# Patient Record
Sex: Male | Born: 1985 | Race: White | Hispanic: No | Marital: Single | Smoking: Former smoker
Health system: Southern US, Community
[De-identification: ages and names within clinical notes are randomized; demographics above are authoritative.]

## PROBLEM LIST (undated history)

## (undated) DIAGNOSIS — B009 Herpesviral infection, unspecified: Secondary | ICD-10-CM

## (undated) HISTORY — PX: HIP SURGERY: SHX245

---

## 2006-07-09 ENCOUNTER — Emergency Department (HOSPITAL_COMMUNITY): Admission: EM | Admit: 2006-07-09 | Discharge: 2006-07-09 | Payer: Self-pay | Admitting: Emergency Medicine

## 2006-10-12 ENCOUNTER — Emergency Department (HOSPITAL_COMMUNITY): Admission: EM | Admit: 2006-10-12 | Discharge: 2006-10-12 | Payer: Self-pay | Admitting: Emergency Medicine

## 2006-11-29 ENCOUNTER — Ambulatory Visit (HOSPITAL_COMMUNITY): Admission: RE | Admit: 2006-11-29 | Discharge: 2006-11-29 | Payer: Self-pay | Admitting: Family Medicine

## 2006-12-08 ENCOUNTER — Emergency Department (HOSPITAL_COMMUNITY): Admission: EM | Admit: 2006-12-08 | Discharge: 2006-12-08 | Payer: Self-pay | Admitting: Emergency Medicine

## 2007-07-10 ENCOUNTER — Emergency Department (HOSPITAL_COMMUNITY): Admission: EM | Admit: 2007-07-10 | Discharge: 2007-07-10 | Payer: Self-pay | Admitting: Emergency Medicine

## 2007-08-18 ENCOUNTER — Emergency Department (HOSPITAL_COMMUNITY): Admission: EM | Admit: 2007-08-18 | Discharge: 2007-08-18 | Payer: Self-pay | Admitting: Emergency Medicine

## 2007-10-09 ENCOUNTER — Emergency Department (HOSPITAL_COMMUNITY): Admission: EM | Admit: 2007-10-09 | Discharge: 2007-10-09 | Payer: Self-pay | Admitting: Family Medicine

## 2008-06-25 ENCOUNTER — Emergency Department (HOSPITAL_COMMUNITY): Admission: EM | Admit: 2008-06-25 | Discharge: 2008-06-25 | Payer: Self-pay | Admitting: Family Medicine

## 2008-11-01 ENCOUNTER — Emergency Department (HOSPITAL_COMMUNITY): Admission: EM | Admit: 2008-11-01 | Discharge: 2008-11-01 | Payer: Self-pay | Admitting: Otolaryngology

## 2009-03-14 ENCOUNTER — Emergency Department (HOSPITAL_COMMUNITY): Admission: EM | Admit: 2009-03-14 | Discharge: 2009-03-14 | Payer: Self-pay | Admitting: Emergency Medicine

## 2009-12-21 ENCOUNTER — Emergency Department (HOSPITAL_COMMUNITY): Admission: EM | Admit: 2009-12-21 | Discharge: 2009-12-21 | Payer: Self-pay | Admitting: Emergency Medicine

## 2010-03-20 ENCOUNTER — Emergency Department (HOSPITAL_COMMUNITY): Admission: EM | Admit: 2010-03-20 | Discharge: 2010-03-20 | Payer: Self-pay | Admitting: Emergency Medicine

## 2010-05-28 ENCOUNTER — Emergency Department (HOSPITAL_COMMUNITY): Admission: EM | Admit: 2010-05-28 | Discharge: 2010-05-28 | Payer: Self-pay | Admitting: Emergency Medicine

## 2010-09-19 ENCOUNTER — Emergency Department (HOSPITAL_COMMUNITY)
Admission: EM | Admit: 2010-09-19 | Discharge: 2010-09-19 | Payer: Self-pay | Source: Home / Self Care | Admitting: Emergency Medicine

## 2010-11-07 ENCOUNTER — Inpatient Hospital Stay (INDEPENDENT_AMBULATORY_CARE_PROVIDER_SITE_OTHER)
Admission: RE | Admit: 2010-11-07 | Discharge: 2010-11-07 | Disposition: A | Discharge status: Home / Self Care | Payer: Self-pay | Source: Ambulatory Visit | Attending: Family Medicine | Admitting: Family Medicine

## 2010-11-07 DIAGNOSIS — L989 Disorder of the skin and subcutaneous tissue, unspecified: Secondary | ICD-10-CM

## 2010-11-10 LAB — HERPES SIMPLEX VIRUS CULTURE: Culture: DETECTED

## 2011-01-13 LAB — URINALYSIS, ROUTINE W REFLEX MICROSCOPIC
Bilirubin Urine: NEGATIVE
Hgb urine dipstick: NEGATIVE
Ketones, ur: NEGATIVE mg/dL
Nitrite: NEGATIVE
Protein, ur: NEGATIVE mg/dL
Specific Gravity, Urine: 1.019 (ref 1.005–1.030)
pH: 7 (ref 5.0–8.0)

## 2011-01-13 LAB — URINE CULTURE: Colony Count: NO GROWTH

## 2011-07-07 LAB — GC/CHLAMYDIA PROBE AMP, GENITAL: GC Probe Amp, Genital: NEGATIVE

## 2011-07-07 LAB — HIV ANTIBODY (ROUTINE TESTING W REFLEX): HIV: NONREACTIVE

## 2011-07-31 ENCOUNTER — Inpatient Hospital Stay (HOSPITAL_COMMUNITY)
Admission: RE | Admit: 2011-07-31 | Discharge: 2011-07-31 | Disposition: A | Discharge status: Home / Self Care | Payer: Self-pay | Source: Ambulatory Visit | Attending: Family Medicine | Admitting: Family Medicine

## 2012-01-03 ENCOUNTER — Emergency Department (HOSPITAL_BASED_OUTPATIENT_CLINIC_OR_DEPARTMENT_OTHER)
Admission: EM | Admit: 2012-01-03 | Discharge: 2012-01-03 | Disposition: A | Discharge status: Home / Self Care | Payer: No Typology Code available for payment source | Attending: Emergency Medicine | Admitting: Emergency Medicine

## 2012-01-03 ENCOUNTER — Emergency Department (INDEPENDENT_AMBULATORY_CARE_PROVIDER_SITE_OTHER): Payer: No Typology Code available for payment source

## 2012-01-03 ENCOUNTER — Encounter (HOSPITAL_BASED_OUTPATIENT_CLINIC_OR_DEPARTMENT_OTHER): Payer: Self-pay | Admitting: *Deleted

## 2012-01-03 DIAGNOSIS — M25569 Pain in unspecified knee: Secondary | ICD-10-CM | POA: Insufficient documentation

## 2012-01-03 DIAGNOSIS — T1490XA Injury, unspecified, initial encounter: Secondary | ICD-10-CM | POA: Insufficient documentation

## 2012-01-03 DIAGNOSIS — Y92410 Unspecified street and highway as the place of occurrence of the external cause: Secondary | ICD-10-CM | POA: Insufficient documentation

## 2012-01-03 MED ORDER — IBUPROFEN 800 MG PO TABS
800.0000 mg | ORAL_TABLET | Freq: Once | ORAL | Status: AC
  Administered 2012-01-03: 800 mg via ORAL
  Filled 2012-01-03: qty 1

## 2012-01-03 MED ORDER — HYDROCODONE-ACETAMINOPHEN 5-500 MG PO TABS: 1.0000 | ORAL_TABLET | Freq: Four times a day (QID) | ORAL | Status: AC | PRN

## 2012-01-03 MED ORDER — OXYCODONE-ACETAMINOPHEN 5-325 MG PO TABS
2.0000 | ORAL_TABLET | Freq: Once | ORAL | Status: AC
  Administered 2012-01-03: 2 via ORAL
  Filled 2012-01-03: qty 2

## 2012-01-03 MED ORDER — NAPROXEN 500 MG PO TABS: 500.0000 mg | ORAL_TABLET | Freq: Two times a day (BID) | ORAL | Status: DC

## 2012-01-03 NOTE — Discharge Instructions (Signed)
Knee Pain The knee is the complex joint between your thigh and your lower leg. It is made up of bones, tendons, ligaments, and cartilage. The bones that make up the knee are:  The femur in the thigh.   The tibia and fibula in the lower leg.   The patella or kneecap riding in the groove on the lower femur.  CAUSES  Knee pain is a common complaint with many causes. A few of these causes are:  Injury, such as:   A ruptured ligament or tendon injury.   Torn cartilage.   Medical conditions, such as:   Gout   Arthritis   Infections   Overuse, over training or overdoing a physical activity.  Knee pain can be minor or severe. Knee pain can accompany debilitating injury. Minor knee problems often respond well to self-care measures or get well on their own. More serious injuries may need medical intervention or even surgery. SYMPTOMS The knee is complex. Symptoms of knee problems can vary widely. Some of the problems are:  Pain with movement and weight bearing.   Swelling and tenderness.   Buckling of the knee.   Inability to straighten or extend your knee.   Your knee locks and you cannot straighten it.   Warmth and redness with pain and fever.   Deformity or dislocation of the kneecap.  DIAGNOSIS  Determining what is wrong may be very straight forward such as when there is an injury. It can also be challenging because of the complexity of the knee. Tests to make a diagnosis may include:  Your caregiver taking a history and doing a physical exam.   Routine X-rays can be used to rule out other problems. X-rays will not reveal a cartilage tear. Some injuries of the knee can be diagnosed by:   Arthroscopy a surgical technique by which a small video camera is inserted through tiny incisions on the sides of the knee. This procedure is used to examine and repair internal knee joint problems. Tiny instruments can be used during arthroscopy to repair the torn knee cartilage  (meniscus).   Arthrography is a radiology technique. A contrast liquid is directly injected into the knee joint. Internal structures of the knee joint then become visible on X-ray film.   An MRI scan is a non x-ray radiology procedure in which magnetic fields and a computer produce two- or three-dimensional images of the inside of the knee. Cartilage tears are often visible using an MRI scanner. MRI scans have largely replaced arthrography in diagnosing cartilage tears of the knee.   Blood work.   Examination of the fluid that helps to lubricate the knee joint (synovial fluid). This is done by taking a sample out using a needle and a syringe.  TREATMENT The treatment of knee problems depends on the cause. Some of these treatments are:  Depending on the injury, proper casting, splinting, surgery or physical therapy care will be needed.   Give yourself adequate recovery time. Do not overuse your joints. If you begin to get sore during workout routines, back off. Slow down or do fewer repetitions.   For repetitive activities such as cycling or running, maintain your strength and nutrition.   Alternate muscle groups. For example if you are a weight lifter, work the upper body on one day and the lower body the next.   Either tight or weak muscles do not give the proper support for your knee. Tight or weak muscles do not absorb the stress placed   on the knee joint. Keep the muscles surrounding the knee strong.   Take care of mechanical problems.   If you have flat feet, orthotics or special shoes may help. See your caregiver if you need help.   Arch supports, sometimes with wedges on the inner or outer aspect of the heel, can help. These can shift pressure away from the side of the knee most bothered by osteoarthritis.   A brace called an "unloader" brace also may be used to help ease the pressure on the most arthritic side of the knee.   If your caregiver has prescribed crutches, braces,  wraps or ice, use as directed. The acronym for this is PRICE. This means protection, rest, ice, compression and elevation.   Nonsteroidal anti-inflammatory drugs (NSAID's), can help relieve pain. But if taken immediately after an injury, they may actually increase swelling. Take NSAID's with food in your stomach. Stop them if you develop stomach problems. Do not take these if you have a history of ulcers, stomach pain or bleeding from the bowel. Do not take without your caregiver's approval if you have problems with fluid retention, heart failure, or kidney problems.   For ongoing knee problems, physical therapy may be helpful.   Glucosamine and chondroitin are over-the-counter dietary supplements. Both may help relieve the pain of osteoarthritis in the knee. These medicines are different from the usual anti-inflammatory drugs. Glucosamine may decrease the rate of cartilage destruction.   Injections of a corticosteroid drug into your knee joint may help reduce the symptoms of an arthritis flare-up. They may provide pain relief that lasts a few months. You may have to wait a few months between injections. The injections do have a small increased risk of infection, water retention and elevated blood sugar levels.   Hyaluronic acid injected into damaged joints may ease pain and provide lubrication. These injections may work by reducing inflammation. A series of shots may give relief for as long as 6 months.   Topical painkillers. Applying certain ointments to your skin may help relieve the pain and stiffness of osteoarthritis. Ask your pharmacist for suggestions. Many over the-counter products are approved for temporary relief of arthritis pain.   In some countries, doctors often prescribe topical NSAID's for relief of chronic conditions such as arthritis and tendinitis. A review of treatment with NSAID creams found that they worked as well as oral medications but without the serious side effects.    PREVENTION  Maintain a healthy weight. Extra pounds put more strain on your joints.   Get strong, stay limber. Weak muscles are a common cause of knee injuries. Stretching is important. Include flexibility exercises in your workouts.   Be smart about exercise. If you have osteoarthritis, chronic knee pain or recurring injuries, you may need to change the way you exercise. This does not mean you have to stop being active. If your knees ache after jogging or playing basketball, consider switching to swimming, water aerobics or other low-impact activities, at least for a few days a week. Sometimes limiting high-impact activities will provide relief.   Make sure your shoes fit well. Choose footwear that is right for your sport.   Protect your knees. Use the proper gear for knee-sensitive activities. Use kneepads when playing volleyball or laying carpet. Buckle your seat belt every time you drive. Most shattered kneecaps occur in car accidents.   Rest when you are tired.  SEEK MEDICAL CARE IF:  You have knee pain that is continual and does not   seem to be getting better.  SEEK IMMEDIATE MEDICAL CARE IF:  Your knee joint feels hot to the touch and you have a high fever. MAKE SURE YOU:   Understand these instructions.   Will watch your condition.   Will get help right away if you are not doing well or get worse.  Document Released: 07/12/2007 Document Revised: 09/03/2011 Document Reviewed: 07/12/2007 [employer] Patient Information 2012 [employer]. 
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
## 2012-01-06 ENCOUNTER — Encounter: Payer: Self-pay | Admitting: Family Medicine

## 2012-01-06 ENCOUNTER — Ambulatory Visit (INDEPENDENT_AMBULATORY_CARE_PROVIDER_SITE_OTHER): Payer: Self-pay | Admitting: Family Medicine

## 2012-01-06 VITALS — BP 117/75 | HR 102 | Temp 97.8°F | Ht 72.0 in | Wt 230.0 lb

## 2012-01-06 DIAGNOSIS — M25569 Pain in unspecified knee: Secondary | ICD-10-CM

## 2012-01-06 DIAGNOSIS — M25562 Pain in left knee: Secondary | ICD-10-CM

## 2012-01-06 NOTE — Patient Instructions (Signed)
Your exam indicates you have a Grade 1 sprain to the MCL of your right knee and LCL of your left knee as well as meniscal contusions. These typically improve after a total of 2-4 weeks. Icing 15 minutes 3-4 times a day. Knee braces tend not to help - time is the biggest thing with these. Aleve 1-2 tabs twice a day with food for pain and inflammation as needed. Tylenol as needed for pain in addition to this. If not improving, would consider MRI and/or physical therapy depending on your exam at that time.

## 2012-01-07 ENCOUNTER — Encounter: Payer: Self-pay | Admitting: Family Medicine

## 2012-01-07 DIAGNOSIS — M25561 Pain in right knee: Secondary | ICD-10-CM | POA: Insufficient documentation

## 2012-01-07 NOTE — Assessment & Plan Note (Signed)
no effusion, mechanism would be unlikely to cause meniscal tears though side to side motion and exam indicates he sprained MCL right knee and LCL left knee as well as has bone/meniscal bruising.  Should resolve after total 2-4 weeks.  Icing, nsaids, tylenol as needed.  Advised to follow-through on applying for cone coverage so can consider PT and/or further imaging if not improving over the next month as expected.  See instructions.
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
## 2012-03-13 ENCOUNTER — Encounter (HOSPITAL_COMMUNITY): Payer: Self-pay | Admitting: *Deleted

## 2012-03-13 ENCOUNTER — Emergency Department (HOSPITAL_COMMUNITY): Payer: Self-pay

## 2012-03-13 ENCOUNTER — Emergency Department (HOSPITAL_COMMUNITY)
Admission: EM | Admit: 2012-03-13 | Discharge: 2012-03-13 | Disposition: A | Discharge status: Home / Self Care | Payer: Self-pay | Attending: Emergency Medicine | Admitting: Emergency Medicine

## 2012-03-13 DIAGNOSIS — F17200 Nicotine dependence, unspecified, uncomplicated: Secondary | ICD-10-CM | POA: Insufficient documentation

## 2012-03-13 DIAGNOSIS — R0789 Other chest pain: Secondary | ICD-10-CM | POA: Insufficient documentation

## 2012-03-13 DIAGNOSIS — R079 Chest pain, unspecified: Secondary | ICD-10-CM

## 2012-03-13 LAB — CBC
MCH: 30.2 pg (ref 26.0–34.0)
RBC: 5.33 MIL/uL (ref 4.22–5.81)
RDW: 13.9 % (ref 11.5–15.5)

## 2012-03-13 LAB — DIFFERENTIAL
Eosinophils Absolute: 0.3 10*3/uL (ref 0.0–0.7)
Eosinophils Relative: 5 % (ref 0–5)
Monocytes Relative: 9 % (ref 3–12)

## 2012-03-13 LAB — COMPREHENSIVE METABOLIC PANEL
BUN: 11 mg/dL (ref 6–23)
Chloride: 102 mEq/L (ref 96–112)
Creatinine, Ser: 1.03 mg/dL (ref 0.50–1.35)
GFR calc non Af Amer: >90 mL/min (ref >90)

## 2012-03-13 MED ORDER — NAPROXEN 500 MG PO TABS: 500.0000 mg | ORAL_TABLET | Freq: Two times a day (BID) | ORAL | Status: DC

## 2012-03-13 NOTE — ED Provider Notes (Signed)
History     CSN: [id number]  Arrival date & time 03/13/12  2036   First MD Initiated Contact with Patient 03/13/12 2302      Chief Complaint  Patient presents with  . Chest Pain    (Consider location/radiation/quality/duration/timing/severity/associated sxs/prior treatment) HPI Comments: Pt is a 26yo male that has had chest pain that comes and goes for the last year. States pain with palpation. Pt state he does exercise and runs 4 miles 4 times a week and does not have pain at that time. States no hx of cardiac problems. No fever, chills, cough, shortness of breath. NO injuries. Has not taken any medications. No fam hx of cardiac problems.   The history is provided by the patient.    History reviewed. No pertinent past medical history.  History reviewed. No pertinent past surgical history.  Family History  Problem Relation Age of Onset  . Diabetes Mother   . Hyperlipidemia Mother   . Hypertension Mother   . Heart attack Neg Hx   . Sudden death Neg Hx     History  Substance Use Topics  . Smoking status: Current Some Day Smoker  . Smokeless tobacco: Not on file  . Alcohol Use: Yes      Review of Systems  Constitutional: Negative for fever and chills.  Respiratory: Positive for chest tightness. Negative for cough, shortness of breath, wheezing and stridor.   Cardiovascular: Positive for chest pain. Negative for palpitations and leg swelling.  Gastrointestinal: Negative.   Musculoskeletal: Negative.   Skin: Negative.   Neurological: Negative for dizziness and weakness.    Allergies  Review of patient's allergies indicates no known allergies.  Home Medications  No current outpatient prescriptions on file.  BP 126/66  Pulse 85  Temp 98.6 F (37 C)  Resp 18  SpO2 98%  Physical Exam  Nursing note and vitals reviewed. Constitutional: He is oriented to person, place, and time. He appears well-developed and well-nourished.  HENT:  Head: Normocephalic.  Eyes:  Conjunctivae are normal.  Neck: Neck supple.  Cardiovascular: Normal rate, regular rhythm and normal heart sounds.   Pulmonary/Chest: Effort normal and breath sounds normal. No respiratory distress. He has no wheezes. He has no rales.       Normal appearing chest. No bruising, swelilng, deformity, crepitus. Tender to palpation over left uper ribs.   Abdominal: Soft. Bowel sounds are normal. He exhibits no distension. There is no tenderness. There is no rebound.  Neurological: He is alert and oriented to person, place, and time.  Skin: Skin is warm and dry.  Psychiatric: He has a normal mood and affect.    ED Course  Procedures (including critical care time)  Pt with reproducible chest pain with palpation for a year. No exertional component, no associated symptoms. TIMI 0. No med problems.    Date: 03/13/2012  Rate: 97  Rhythm: normal sinus rhythm  QRS Axis: normal  Intervals: normal  ST/T Wave abnormalities: normal  Conduction Disutrbances:none  Narrative Interpretation:   Old EKG Reviewed: unchanged  Results for orders placed during the hospital encounter of 03/13/12  CBC      Component Value Range   WBC 6.5  4.0 - 10.5 K/uL   RBC 5.33  4.22 - 5.81 MIL/uL   Hemoglobin 16.1  13.0 - 17.0 g/dL   HCT 40.9  81.1 - 91.4 %   MCV 85.7  78.0 - 100.0 fL   MCH 30.2  26.0 - 34.0 pg   MCHC 35.2  30.0 - 36.0 g/dL   RDW 16.1  09.6 - 04.5 %   Platelets 121 (*) 150 - 400 K/uL  DIFFERENTIAL      Component Value Range   Neutrophils Relative 51  43 - 77 %   Neutro Abs 3.3  1.7 - 7.7 K/uL   Lymphocytes Relative 34  12 - 46 %   Lymphs Abs 2.2  0.7 - 4.0 K/uL   Monocytes Relative 9  3 - 12 %   Monocytes Absolute 0.6  0.1 - 1.0 K/uL   Eosinophils Relative 5  0 - 5 %   Eosinophils Absolute 0.3  0.0 - 0.7 K/uL   Basophils Relative 1  0 - 1 %   Basophils Absolute 0.0  0.0 - 0.1 K/uL  COMPREHENSIVE METABOLIC PANEL      Component Value Range   Sodium 139  135 - 145 mEq/L   Potassium 3.8  3.5  - 5.1 mEq/L   Chloride 102  96 - 112 mEq/L   CO2 26  19 - 32 mEq/L   Glucose, Bld 94  70 - 99 mg/dL   BUN 11  6 - 23 mg/dL   Creatinine, Ser 4.09  0.50 - 1.35 mg/dL   Calcium 9.9  8.4 - 81.1 mg/dL   Total Protein 7.6  6.0 - 8.3 g/dL   Albumin 4.7  3.5 - 5.2 g/dL   AST 29  0 - 37 U/L   ALT 45  0 - 53 U/L   Alkaline Phosphatase 60  39 - 117 U/L   Total Bilirubin 0.5  0.3 - 1.2 mg/dL   GFR calc non Af Amer >90  >90 mL/min   GFR calc Af Amer >90  >90 mL/min  TROPONIN I      Component Value Range   Troponin I <0.30  <0.30 ng/mL   Dg Chest 2 View  03/13/2012  *RADIOLOGY REPORT*  Clinical Data: Left-sided chest pain.  CHEST - 2 VIEW  Comparison:  03/20/2010  Findings:  The heart size and mediastinal contours are within normal limits.  Both lungs are clear.  The visualized skeletal structures are unremarkable.  IMPRESSION: No active cardiopulmonary disease.  Original Report Authenticated By: Danae Orleans, M.D.    11:29 PM Negative labs. I suspect pts pain is musculoskeletal, possible constocondritis. Doubt PE, wells and perc negative. Will d/c home with PCP follow up.   1. Chest pain   2. Costochondral chest pain       MDM          Lottie Mussel, PA 03/14/12 0126

## 2012-03-13 NOTE — ED Notes (Signed)
Pt states pain increase with movement sharp in nature. Pt placed on monitor. Titayana pa at bedside.

## 2012-03-13 NOTE — ED Notes (Signed)
Pt not in room for discharge. Verbal instructions given per [place] PA. Pt alert x3 nad.

## 2012-03-13 NOTE — Discharge Instructions (Signed)
Your lab work and chest x-ray are normal. Stop smoking. Naprosyn for pain. Follow up with primary care doctor.   Chest Wall Pain Chest wall pain is pain in or around the bones and muscles of your chest. It may take up to 6 weeks to get better. It may take longer if you must stay physically active in your work and activities.  CAUSES  Chest wall pain may happen on its own. However, it may be caused by:  A viral illness like the flu.   Injury.   Coughing.   Exercise.   Arthritis.   Fibromyalgia.   Shingles.  HOME CARE INSTRUCTIONS   Avoid overtiring physical activity. Try not to strain or perform activities that cause pain. This includes any activities using your chest or your abdominal and side muscles, especially if heavy weights are used.   Put ice on the sore area.   Put ice in a plastic bag.   Place a towel between your skin and the bag.   Leave the ice on for 15 to 20 minutes per hour while awake for the first 2 days.   Only take over-the-counter or prescription medicines for pain, discomfort, or fever as directed by your caregiver.  SEEK IMMEDIATE MEDICAL CARE IF:   Your pain increases, or you are very uncomfortable.   You have a fever.   Your chest pain becomes worse.   You have new, unexplained symptoms.   You have nausea or vomiting.   You feel sweaty or lightheaded.   You have a cough with phlegm (sputum), or you cough up blood.  MAKE SURE YOU:   Understand these instructions.   Will watch your condition.   Will get help right away if you are not doing well or get worse.  Document Released: 09/14/2005 Document Revised: 09/03/2011 Document Reviewed: 05/11/2011 [facility] Patient Information 2012 [place], [place].

## 2012-03-13 NOTE — ED Notes (Signed)
Lt upper chest pain for 3 months that comes and goes.  On fathers day he decided to have it checked

## 2012-03-14 NOTE — ED Provider Notes (Signed)
Medical screening examination/treatment/procedure(s) were performed by non-physician practitioner and as supervising physician I was immediately available for consultation/collaboration.   Arlene Brickel A Aryonna Gunnerson, MD 03/14/12 0602 

## 2012-09-20 ENCOUNTER — Encounter (HOSPITAL_BASED_OUTPATIENT_CLINIC_OR_DEPARTMENT_OTHER): Payer: Self-pay | Admitting: *Deleted

## 2012-09-20 ENCOUNTER — Emergency Department (HOSPITAL_BASED_OUTPATIENT_CLINIC_OR_DEPARTMENT_OTHER)
Admission: EM | Admit: 2012-09-20 | Discharge: 2012-09-20 | Disposition: A | Discharge status: Home / Self Care | Payer: Self-pay | Attending: Emergency Medicine | Admitting: Emergency Medicine

## 2012-09-20 ENCOUNTER — Emergency Department (HOSPITAL_BASED_OUTPATIENT_CLINIC_OR_DEPARTMENT_OTHER): Payer: Self-pay

## 2012-09-20 DIAGNOSIS — M719 Bursopathy, unspecified: Secondary | ICD-10-CM | POA: Insufficient documentation

## 2012-09-20 DIAGNOSIS — F17200 Nicotine dependence, unspecified, uncomplicated: Secondary | ICD-10-CM | POA: Insufficient documentation

## 2012-09-20 DIAGNOSIS — Z791 Long term (current) use of non-steroidal anti-inflammatories (NSAID): Secondary | ICD-10-CM | POA: Insufficient documentation

## 2012-09-20 DIAGNOSIS — X500XXA Overexertion from strenuous movement or load, initial encounter: Secondary | ICD-10-CM | POA: Insufficient documentation

## 2012-09-20 DIAGNOSIS — Y929 Unspecified place or not applicable: Secondary | ICD-10-CM | POA: Insufficient documentation

## 2012-09-20 DIAGNOSIS — M25419 Effusion, unspecified shoulder: Secondary | ICD-10-CM | POA: Insufficient documentation

## 2012-09-20 DIAGNOSIS — M67919 Unspecified disorder of synovium and tendon, unspecified shoulder: Secondary | ICD-10-CM | POA: Insufficient documentation

## 2012-09-20 DIAGNOSIS — Y939 Activity, unspecified: Secondary | ICD-10-CM | POA: Insufficient documentation

## 2012-09-20 MED ORDER — HYDROCODONE-ACETAMINOPHEN 5-325 MG PO TABS: 2.0000 | ORAL_TABLET | ORAL | Status: DC | PRN

## 2012-09-20 MED ORDER — IBUPROFEN 800 MG PO TABS: 800.0000 mg | ORAL_TABLET | Freq: Three times a day (TID) | ORAL | Status: DC

## 2012-09-20 NOTE — ED Provider Notes (Signed)
History     CSN: [id number]  Arrival date & time 09/20/12  1302   First MD Initiated Contact with Patient 09/20/12 1516      Chief Complaint  Patient presents with  . Shoulder Pain    (Consider location/radiation/quality/duration/timing/severity/associated sxs/prior treatment) Patient is a 26 y.o. male presenting with shoulder pain. The history is provided by the patient. No language interpreter was used.  Shoulder Pain This is a new problem. The current episode started today. The problem occurs constantly. The problem has been gradually worsening. Associated symptoms include joint swelling. The symptoms are aggravated by bending. He has tried nothing for the symptoms. The treatment provided moderate relief.   Pt complains of pain in left shoulder for a month.   Pt thinks he may have injured while lifting. History reviewed. No pertinent past medical history.  History reviewed. No pertinent past surgical history.  Family History  Problem Relation Age of Onset  . Diabetes Mother   . Hyperlipidemia Mother   . Hypertension Mother   . Heart attack Neg Hx   . Sudden death Neg Hx     History  Substance Use Topics  . Smoking status: Current Some Day Smoker  . Smokeless tobacco: Not on file  . Alcohol Use: Yes      Review of Systems  Musculoskeletal: Positive for joint swelling.  All other systems reviewed and are negative.    Allergies  Review of patient's allergies indicates no known allergies.  Home Medications   Current Outpatient Rx  Name  Route  Sig  Dispense  Refill  . NAPROXEN 500 MG PO TABS   Oral   Take 1 tablet (500 mg total) by mouth 2 (two) times daily.   30 tablet   0     BP 116/60  Pulse 68  Temp 98 F (36.7 C) (Oral)  Resp 20  SpO2 100%  Physical Exam  Nursing note and vitals reviewed. Constitutional: He is oriented to person, place, and time. He appears well-developed and well-nourished.  HENT:  Head: Normocephalic and atraumatic.   Eyes: Pupils are equal, round, and reactive to light.  Neck: Normal range of motion. Neck supple.  Cardiovascular: Normal rate.   Pulmonary/Chest: Effort normal.  Musculoskeletal: Normal range of motion. He exhibits tenderness.       Tender left shoulder,  Decreased range of motion,   nv and ns intact,  Pain with abduction  Neurological: He is alert and oriented to person, place, and time. He has normal reflexes.  Skin: Skin is warm.    ED Course  Procedures (including critical care time)  Labs Reviewed - No data to display Dg Shoulder Left  09/20/2012  *RADIOLOGY REPORT*  Clinical Data: Injury 1 month ago.  Pain.  LEFT SHOULDER - 2+ VIEW  Comparison: None.  Findings: Imaged bones, joints and soft tissues appear normal.  IMPRESSION: Normal study.   Original Report Authenticated By: Holley Dexter, M.D.      No diagnosis found.    MDM  Proable rotator cuff tendonitis.   Pt given rx for ibuprofen and hydrocodone.   Pt advised to see Dr. Pearletha Forge for followup        Elson Areas, [place] 09/20/12 1534

## 2012-09-20 NOTE — ED Notes (Signed)
Left shoulder pain. Injury a month ago and has not gotten better.

## 2012-09-20 NOTE — ED Notes (Signed)
MD at bedside. 

## 2012-09-23 ENCOUNTER — Ambulatory Visit (INDEPENDENT_AMBULATORY_CARE_PROVIDER_SITE_OTHER): Payer: Self-pay | Admitting: Sports Medicine

## 2012-09-23 ENCOUNTER — Encounter: Payer: Self-pay | Admitting: Sports Medicine

## 2012-09-23 VITALS — BP 131/70 | HR 76 | Wt 242.0 lb

## 2012-09-23 DIAGNOSIS — M25512 Pain in left shoulder: Secondary | ICD-10-CM | POA: Insufficient documentation

## 2012-09-23 DIAGNOSIS — M25519 Pain in unspecified shoulder: Secondary | ICD-10-CM

## 2012-09-23 MED ORDER — HYDROCODONE-ACETAMINOPHEN 5-325 MG PO TABS
1.0000 | ORAL_TABLET | Freq: Three times a day (TID) | ORAL | Status: AC | PRN
Start: 2012-09-23 — End: 2012-10-03

## 2012-09-23 MED ORDER — MELOXICAM 15 MG PO TABS: ORAL_TABLET | ORAL | Status: DC

## 2012-09-23 NOTE — Progress Notes (Signed)
Subjective:    CC: Establish care. 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 Unfortunately he developed pain he localizes on the anterior aspect of his left shoulder which is worse with reaching and essentially any direction. He denies any trauma. The pain is localized and does not radiate. It is severe, it is waking him up at night. He's tried some hydrocodone prescribed at the emergency department which is only been moderately effective. He has not been doing any rehabilitation exercises.  Past medical history, Surgical history, Family history, Social history, Allergies, and medications have been entered into the medical record, reviewed, and no changes needed.   Review of Systems: No headache, visual changes, nausea, vomiting, diarrhea, constipation, dizziness, abdominal pain, skin rash, fevers, chills, night sweats, swollen lymph nodes, weight loss, chest pain, body aches, joint swelling, muscle aches, shortness of breath, mood changes, visual or auditory hallucinations.  Objective:    General: Well Developed, well nourished, and in no acute distress.  Neuro: Alert and oriented x3, extra-ocular muscles intact.  HEENT: Normocephalic, atraumatic, pupils equal round reactive to light, neck supple, no masses, no lymphadenopathy, thyroid nonpalpable.  Skin: Warm and dry, no rashes noted.  Cardiac: Regular rate and rhythm, no murmurs rubs or gallops.  Respiratory: Clear to auscultation bilaterally. Not using accessory muscles, speaking in full sentences.  Abdominal: Soft, nontender, nondistended, positive bowel sounds, no masses, no organomegaly.  Left Shoulder: Inspection reveals no abnormalities, atrophy or asymmetry. Reproduction of pain with palpation over the bicipital groove. ROM is full in all planes. Rotator cuff strength normal throughout. No signs of impingement  with negative Neer and Hawkin's tests, empty can sign. Positive speeds and Yergason test. No labral pathology noted with negative Obrien's, negative clunk and good stability. Normal scapular function observed. No painful arc and no drop arm sign. No apprehension sign  Procedure: Real-time Ultrasound Guided Injection of left biceps tendon sheath Device: GE Logiq E  Ultrasound guided injection is preferred based studies that show increased duration, increased effect, greater accuracy, decreased procedural pain, increased response rate, and decreased cost with ultrasound guided versus blind injection.  Verbal informed consent obtained.  Time-out conducted.  Noted no overlying erythema, induration, or other signs of local infection.  Skin prepped in a sterile fashion.  Local anesthesia: Topical Ethyl chloride.  With sterile technique and under real time ultrasound guidance:  Needle advanced to enter tubercular groove, care was taken to avoid puncturing the biceps tendon itself. 1 cc of Kenalog 40, 3 cc lidocaine injected easily, and biceps tendon sheath seen distending, no intratendinous injection was performed. Completed without difficulty  Pain immediately resolved suggesting accurate placement of the medication.  Advised to call if fevers/chills, erythema, induration, drainage, or persistent bleeding.  Images permanently stored and available for review in the ultrasound unit.  Impression: Technically successful ultrasound guided injection.  Impression and Recommendations:    The patient was counselled, risk factors were discussed, anticipatory guidance given.

## 2012-09-23 NOTE — Assessment & Plan Note (Signed)
Exam is more specific for biceps tendinitis, cuff testing is equivocal. Ultrasound guided injection as above. Formal physical therapy along with Mobic. Return to clinic in 4 weeks to reassess.

## 2012-09-27 NOTE — ED Provider Notes (Signed)
Medical screening examination/treatment/procedure(s) were performed by non-physician practitioner and as supervising physician I was immediately available for consultation/collaboration.   Rolan Bucco, MD 09/27/12 [phone]

## 2012-09-30 ENCOUNTER — Ambulatory Visit: Payer: Self-pay | Admitting: Physical Therapy

## 2012-10-21 ENCOUNTER — Ambulatory Visit: Payer: Self-pay | Admitting: Sports Medicine

## 2012-10-21 DIAGNOSIS — Z0289 Encounter for other administrative examinations: Secondary | ICD-10-CM

## 2013-01-31 ENCOUNTER — Emergency Department (HOSPITAL_COMMUNITY)
Admission: EM | Admit: 2013-01-31 | Discharge: 2013-01-31 | Disposition: A | Discharge status: Home / Self Care | Payer: Self-pay | Attending: Emergency Medicine | Admitting: Emergency Medicine

## 2013-01-31 ENCOUNTER — Encounter (HOSPITAL_COMMUNITY): Payer: Self-pay | Admitting: *Deleted

## 2013-01-31 DIAGNOSIS — K0889 Other specified disorders of teeth and supporting structures: Secondary | ICD-10-CM

## 2013-01-31 DIAGNOSIS — K089 Disorder of teeth and supporting structures, unspecified: Secondary | ICD-10-CM | POA: Insufficient documentation

## 2013-01-31 DIAGNOSIS — F17200 Nicotine dependence, unspecified, uncomplicated: Secondary | ICD-10-CM | POA: Insufficient documentation

## 2013-01-31 MED ORDER — HYDROCODONE-ACETAMINOPHEN 5-325 MG PO TABS: 2.0000 | ORAL_TABLET | ORAL | Status: DC | PRN

## 2013-01-31 MED ORDER — PENICILLIN V POTASSIUM 500 MG PO TABS: 500.0000 mg | ORAL_TABLET | Freq: Four times a day (QID) | ORAL | Status: DC

## 2013-01-31 NOTE — ED Provider Notes (Signed)
History     CSN: [id number]  Arrival date & time 01/31/13  0558   First MD Initiated Contact with Patient 01/31/13 0602      Chief Complaint  Patient presents with  . Dental Pain    (Consider location/radiation/quality/duration/timing/severity/associated sxs/prior treatment) HPI Comments: The patient is a 27 year old otherwise healthy male who presents with dental pain that started yesterday suddenly when he was eating cereal and his tooth broke. The dental pain is severe, constant and progressively worsening. The pain is aching and located in left lower molar. The pain does not radiate. Eating makes the pain worse. Nothing makes the pain better. The patient has not tried anything for pain. No associated symptoms. Patient denies headache, neck pain/stiffness, fever, NVD, edema, sore throat, throat swelling, wheezing, SOB, chest pain, abdominal pain.      History reviewed. No pertinent past medical history.  History reviewed. No pertinent past surgical history.  Family History  Problem Relation Age of Onset  . Diabetes Mother   . Hyperlipidemia Mother   . Hypertension Mother   . Heart attack Neg Hx   . Sudden death Neg Hx     History  Substance Use Topics  . Smoking status: Current Some Day Smoker  . Smokeless tobacco: Not on file  . Alcohol Use: Yes      Review of Systems  HENT: Positive for dental problem.   All other systems reviewed and are negative.    Allergies  Review of patient's allergies indicates no known allergies.  Home Medications  No current outpatient prescriptions on file.  BP 150/70  Pulse 76  Temp(Src) 97.5 F (36.4 C) (Oral)  Resp 19  SpO2 98%  Physical Exam  Nursing note and vitals reviewed. Constitutional: He is oriented to person, place, and time. He appears well-developed and well-nourished. No distress.  HENT:  Head: Normocephalic and atraumatic.  Mouth/Throat: Oropharynx is clear and moist. No oropharyngeal exudate.  Poor  dentition. Multiple cracked and rotten teeth. Crack noted in left lower molar that is tender to percussion.   Eyes: Conjunctivae are normal.  Neck: Normal range of motion.  Cardiovascular: Normal rate and regular rhythm.  Exam reveals no gallop and no friction rub.   No murmur heard. Pulmonary/Chest: Effort normal and breath sounds normal. He has no wheezes. He has no rales. He exhibits no tenderness.  Abdominal: Soft. There is no tenderness.  Musculoskeletal: Normal range of motion.  Neurological: He is alert and oriented to person, place, and time. Coordination normal.  Speech is goal-oriented. Moves limbs without ataxia.   Skin: Skin is warm and dry.  Psychiatric: He has a normal mood and affect. His behavior is normal.    ED Course  Procedures (including critical care time)  Labs Reviewed - No data to display No results found.   1. Pain, dental       MDM  6:27 AM Patient will have pain medication and antibiotics. He has a follow up with dentist in 6 days. Patient afebrile with stable vitals. Patient instructed to return with worsening or concerning symptoms.   Emilia Beck, PA-C 01/31/13 1506

## 2013-01-31 NOTE — ED Notes (Signed)
Patient stated he was eating some cereal yesterday morning and his tooth broke left bottom back tooth

## 2013-01-31 NOTE — ED Provider Notes (Signed)
Medical screening examination/treatment/procedure(s) were performed by non-physician practitioner and as supervising physician I was immediately available for consultation/collaboration.  Clary Meeker M Manvir Thorson, MD 01/31/13 2032 

## 2013-06-06 ENCOUNTER — Emergency Department (HOSPITAL_BASED_OUTPATIENT_CLINIC_OR_DEPARTMENT_OTHER)
Admission: EM | Admit: 2013-06-06 | Discharge: 2013-06-06 | Disposition: A | Discharge status: Home / Self Care | Payer: Self-pay | Attending: Emergency Medicine | Admitting: Emergency Medicine

## 2013-06-06 ENCOUNTER — Encounter (HOSPITAL_BASED_OUTPATIENT_CLINIC_OR_DEPARTMENT_OTHER): Payer: Self-pay

## 2013-06-06 DIAGNOSIS — K0889 Other specified disorders of teeth and supporting structures: Secondary | ICD-10-CM

## 2013-06-06 DIAGNOSIS — Z792 Long term (current) use of antibiotics: Secondary | ICD-10-CM | POA: Insufficient documentation

## 2013-06-06 DIAGNOSIS — K089 Disorder of teeth and supporting structures, unspecified: Secondary | ICD-10-CM | POA: Insufficient documentation

## 2013-06-06 DIAGNOSIS — Z79899 Other long term (current) drug therapy: Secondary | ICD-10-CM | POA: Insufficient documentation

## 2013-06-06 DIAGNOSIS — F17200 Nicotine dependence, unspecified, uncomplicated: Secondary | ICD-10-CM | POA: Insufficient documentation

## 2013-06-06 MED ORDER — PENICILLIN V POTASSIUM 500 MG PO TABS: 500.0000 mg | ORAL_TABLET | Freq: Four times a day (QID) | ORAL | Status: DC

## 2013-06-06 MED ORDER — HYDROCODONE-ACETAMINOPHEN 5-325 MG PO TABS: 2.0000 | ORAL_TABLET | ORAL | Status: DC | PRN

## 2013-06-06 NOTE — ED Notes (Signed)
Pt states that he has a broken wisdom tooth in the Left Upper jaw.  Pt is followed by Dr Regino Schultze with [facility] dental clinic.  Pt was advised to come to ED for antibiotics so that tooth could be removed when he follows up with their clinic.

## 2013-06-06 NOTE — ED Provider Notes (Signed)
CSN: [id number]     Arrival date & time 06/06/13  1221 History   First MD Initiated Contact with Patient 06/06/13 1236     Chief Complaint  Patient presents with  . Dental Pain   (Consider location/radiation/quality/duration/timing/severity/associated sxs/prior Treatment) HPI Comments: Patient here with left upper wisdom tooth pain - he reports that he has an appointment with his oral surgeon on the 18th but states that he told him to come here for pain medication and antibiotics to prepare for this.  He reports sensitivity to cold and heat - denies inability to open mouth wide, foul taste in mouth.  Patient is a 27 y.o. male presenting with tooth pain. The history is provided by the patient. No language interpreter was used.  Dental Pain Location:  Upper Upper teeth location:  16/LU 3rd molar Quality:  Pulsating and radiating Severity:  Severe Onset quality:  Gradual Duration:  2 days Timing:  Constant Progression:  Worsening Chronicity:  Recurrent Context: dental caries   Context: not abscess, cap still on, not crown fracture, not dental fracture, not enamel fracture, filling still in place, not intrusion, not malocclusion, normal dentition, not recent dental surgery and not trauma   Previous work-up:  Dental exam Relieved by:  Nothing Worsened by:  Cold food/drink Ineffective treatments:  None tried Associated symptoms: no congestion, no difficulty swallowing, no drooling, no facial pain, no facial swelling, no fever, no gum swelling, no headaches, no neck pain, no neck swelling, no oral bleeding, no oral lesions and no trismus     History reviewed. No pertinent past medical history. History reviewed. No pertinent past surgical history. Family History  Problem Relation Age of Onset  . Diabetes Mother   . Hyperlipidemia Mother   . Hypertension Mother   . Heart attack Neg Hx   . Sudden death Neg Hx    History  Substance Use Topics  . Smoking status: Current Some Day Smoker     Types: Cigarettes  . Smokeless tobacco: Never Used  . Alcohol Use: Yes    Review of Systems  Constitutional: Negative for fever.  HENT: Positive for dental problem. Negative for congestion, facial swelling, drooling, mouth sores and neck pain.   Neurological: Negative for headaches.  All other systems reviewed and are negative.    Allergies  Review of patient's allergies indicates no known allergies.  Home Medications   Current Outpatient Rx  Name  Route  Sig  Dispense  Refill  . HYDROcodone-acetaminophen (NORCO/VICODIN) 5-325 MG per tablet   Oral   Take 2 tablets by mouth every 4 (four) hours as needed for pain.   12 tablet   0     Only fill this prescription in conjunction with an ...   . penicillin v potassium (VEETID) 500 MG tablet   Oral   Take 1 tablet (500 mg total) by mouth 4 (four) times daily.   40 tablet   0    BP 142/77  Pulse 69  Temp(Src) 98.2 F (36.8 C) (Oral)  Resp 20  Ht 6' (1.829 m)  Wt 240 lb (108.863 kg)  BMI 32.54 kg/m2  SpO2 100% Physical Exam  Nursing note and vitals reviewed. Constitutional: He is oriented to person, place, and time. He appears well-developed and well-nourished.  HENT:  Head: Normocephalic and atraumatic.  Right Ear: External ear normal.  Left Ear: External ear normal.  Nose: Nose normal.  Mouth/Throat: Oropharynx is clear and moist. No oropharyngeal exudate.  Tooth #1 and 16 are both  impacted and breaking through gum line - mild erythema noted to #16  Eyes: Conjunctivae are normal. Pupils are equal, round, and reactive to light.  Neck: Normal range of motion. Neck supple.  Cardiovascular: Normal rate, regular rhythm and normal heart sounds.  Exam reveals no gallop and no friction rub.   No murmur heard. Pulmonary/Chest: Effort normal and breath sounds normal. No respiratory distress.  Abdominal: Soft. Bowel sounds are normal. He exhibits no distension. There is no tenderness.  Musculoskeletal: Normal range of  motion. He exhibits no edema and no tenderness.  Lymphadenopathy:    He has no cervical adenopathy.  Neurological: He is alert and oriented to person, place, and time. No cranial nerve deficit.  Skin: Skin is warm and dry. No rash noted. No erythema. No pallor.  Psychiatric: He has a normal mood and affect. His behavior is normal. Judgment and thought content normal.    ED Course  Procedures (including critical care time) Labs Review Labs Reviewed - No data to display Imaging Review No results found.  MDM   1. Pain, dental    Patient to follow up with Dr. Regino Schultze with [facility], will give abx here and short course of pain medication.    Izola Price Marisue Humble, PA-C 06/06/13 1258

## 2013-06-07 NOTE — ED Provider Notes (Signed)
Medical screening examination/treatment/procedure(s) were performed by non-physician practitioner and as supervising physician I was immediately available for consultation/collaboration.   Charles B. Sheldon, MD 06/07/13 1411 

## 2013-06-09 DIAGNOSIS — T360X5A Adverse effect of penicillins, initial encounter: Secondary | ICD-10-CM | POA: Insufficient documentation

## 2013-06-09 DIAGNOSIS — Z792 Long term (current) use of antibiotics: Secondary | ICD-10-CM | POA: Insufficient documentation

## 2013-06-09 DIAGNOSIS — R21 Rash and other nonspecific skin eruption: Secondary | ICD-10-CM | POA: Insufficient documentation

## 2013-06-09 DIAGNOSIS — R209 Unspecified disturbances of skin sensation: Secondary | ICD-10-CM | POA: Insufficient documentation

## 2013-06-09 DIAGNOSIS — Z87891 Personal history of nicotine dependence: Secondary | ICD-10-CM | POA: Insufficient documentation

## 2013-06-09 DIAGNOSIS — R609 Edema, unspecified: Secondary | ICD-10-CM | POA: Insufficient documentation

## 2013-06-09 DIAGNOSIS — L539 Erythematous condition, unspecified: Secondary | ICD-10-CM | POA: Insufficient documentation

## 2013-06-10 ENCOUNTER — Encounter (HOSPITAL_COMMUNITY): Payer: Self-pay | Admitting: Emergency Medicine

## 2013-06-10 ENCOUNTER — Emergency Department (HOSPITAL_COMMUNITY)
Admission: EM | Admit: 2013-06-10 | Discharge: 2013-06-10 | Disposition: A | Discharge status: Home / Self Care | Payer: Self-pay | Attending: Emergency Medicine | Admitting: Emergency Medicine

## 2013-06-10 DIAGNOSIS — T7840XA Allergy, unspecified, initial encounter: Secondary | ICD-10-CM

## 2013-06-10 NOTE — ED Provider Notes (Signed)
Medical screening examination/treatment/procedure(s) were performed by non-physician practitioner and as supervising physician I was immediately available for consultation/collaboration.  Darlys Gales, MD 06/10/13 [phone]

## 2013-06-10 NOTE — ED Notes (Signed)
Patient states he is feeling hot in his ears, they are itchy.   Patient started PCN for toothache a few days ago.  No shortness of breath.

## 2013-06-10 NOTE — ED Provider Notes (Signed)
CSN: [id number]     Arrival date & time 06/09/13  2357 History   None    Chief Complaint  Patient presents with  . Hot Flashes   (Consider location/radiation/quality/duration/timing/severity/associated sxs/prior Treatment) HPI History provided by pt.   Pt presents w/ burning sensation, rash, edema and erythema of ears since this evening.  Describes rash as little bumps.  No associated fever, other skin changes, edema of lips/tongue, throat tightness or dyspnea.  No known allergies.  Has been on penicillin for several days for dental pain.  Otherwise healthy. History reviewed. No pertinent past medical history. History reviewed. No pertinent past surgical history. Family History  Problem Relation Age of Onset  . Diabetes Mother   . Hyperlipidemia Mother   . Hypertension Mother   . Heart attack Neg Hx   . Sudden death Neg Hx    History  Substance Use Topics  . Smoking status: Former Smoker    Types: Cigarettes  . Smokeless tobacco: Never Used  . Alcohol Use: Yes     Comment: socially    Review of Systems  All other systems reviewed and are negative.    Allergies  Review of patient's allergies indicates no known allergies.  Home Medications   Current Outpatient Rx  Name  Route  Sig  Dispense  Refill  . ibuprofen (ADVIL,MOTRIN) 200 MG tablet   Oral   Take 800 mg by mouth every 6 (six) hours as needed for pain.         Marland Kitchen penicillin v potassium (VEETID) 500 MG tablet   Oral   Take 1 tablet (500 mg total) by mouth 4 (four) times daily.   40 tablet   0    BP 109/71  Pulse 57  Temp(Src) 97.3 F (36.3 C) (Oral)  Resp 17  SpO2 97% Physical Exam  Nursing note and vitals reviewed. Constitutional: He is oriented to person, place, and time. He appears well-developed and well-nourished. No distress.  HENT:  Head: Normocephalic and atraumatic.  Mouth/Throat: Oropharynx is clear and moist and mucous membranes are normal. No posterior oropharyngeal edema.  Erythema  bilateral cheeks and external ears.  No rash, obvious edema or tenderness of ears.  No lip or tongue edema.  Eyes:  Normal appearance  Neck: Normal range of motion.  Cardiovascular: Normal rate and regular rhythm.   Pulmonary/Chest: Effort normal and breath sounds normal. No stridor. No respiratory distress.  Musculoskeletal: Normal range of motion.  Neurological: He is alert and oriented to person, place, and time.  Skin: Skin is warm and dry. No rash noted.  Psychiatric: He has a normal mood and affect. His behavior is normal.    ED Course  Procedures (including critical care time) Labs Review Labs Reviewed - No data to display Imaging Review No results found.  MDM   1. Allergic reaction, initial encounter    27yo M presents w/ erythema/edema/burning sensation of bilateral ears.  Acute onset while driving this evening and sx have improved spontaneously here in ED.  May be an allergic reaction to penicillin, which he was prescribed in ED recently for dental pain.  No signs of impending airway compromise.  Recommended that he d/c penicillin, take benadryl +/- pepcid, f/u with dentist and return to ER if sx worsen.      Otilio Miu, PA-C 06/10/13 [phone]

## 2013-12-08 ENCOUNTER — Encounter (HOSPITAL_COMMUNITY): Payer: Self-pay | Admitting: Emergency Medicine

## 2013-12-08 ENCOUNTER — Emergency Department (HOSPITAL_COMMUNITY): Payer: Self-pay

## 2013-12-08 ENCOUNTER — Emergency Department (HOSPITAL_COMMUNITY)
Admission: EM | Admit: 2013-12-08 | Discharge: 2013-12-08 | Disposition: A | Discharge status: Home / Self Care | Payer: Self-pay | Attending: Emergency Medicine | Admitting: Emergency Medicine

## 2013-12-08 DIAGNOSIS — Z87828 Personal history of other (healed) physical injury and trauma: Secondary | ICD-10-CM | POA: Insufficient documentation

## 2013-12-08 DIAGNOSIS — Z87891 Personal history of nicotine dependence: Secondary | ICD-10-CM | POA: Insufficient documentation

## 2013-12-08 DIAGNOSIS — M25569 Pain in unspecified knee: Secondary | ICD-10-CM | POA: Insufficient documentation

## 2013-12-08 DIAGNOSIS — F911 Conduct disorder, childhood-onset type: Secondary | ICD-10-CM | POA: Insufficient documentation

## 2013-12-08 DIAGNOSIS — Z792 Long term (current) use of antibiotics: Secondary | ICD-10-CM | POA: Insufficient documentation

## 2013-12-08 DIAGNOSIS — M25562 Pain in left knee: Secondary | ICD-10-CM

## 2013-12-08 MED ORDER — ACETAMINOPHEN-CODEINE #3 300-30 MG PO TABS: 1.0000 | ORAL_TABLET | Freq: Four times a day (QID) | ORAL | Status: DC | PRN

## 2013-12-08 MED ORDER — ACETAMINOPHEN-CODEINE #3 300-30 MG PO TABS
1.0000 | ORAL_TABLET | Freq: Once | ORAL | Status: AC
  Administered 2013-12-08: 1 via ORAL
  Filled 2013-12-08: qty 1

## 2013-12-08 MED ORDER — MELOXICAM 7.5 MG PO TABS: 15.0000 mg | ORAL_TABLET | Freq: Every day | ORAL | Status: DC

## 2013-12-08 NOTE — ED Notes (Signed)
Pt refused to sign discharge paper, states he want to stronger pain med prescription other than tylenol 3. Pt states he wants to talk to "somebody in the hospital." Pt informed that we can let charge nurse come to talk to him. Pt walked out without his crutches. [place]ink PA made aware. ED staff could not locate the pt at this time.

## 2013-12-08 NOTE — ED Notes (Signed)
Pt states he woke up this morning and his left knee was hurting this morning. No known injury. States he went running last night but nothing unusual.

## 2013-12-08 NOTE — ED Notes (Signed)
Pt still in x-ray

## 2013-12-08 NOTE — Discharge Instructions (Signed)
Please follow up with your primary care physician in 1-2 days. If you do not have one [facility] listed above. Please follow RICE method below. Please take pain medication and/or muscle relaxants as prescribed and as needed for pain. Please do not drive on narcotic pain medication or on muscle relaxants. Please read all discharge instructions and return precautions.   Knee Pain Knee pain can be a result of an injury or other medical conditions. Treatment will depend on the cause of your pain. HOME CARE  Only take medicine as told by your doctor.  Keep a healthy weight. Being overweight can make the knee hurt more.  Stretch before exercising or playing sports.  If there is constant knee pain, change the way you exercise. Ask your doctor for advice.  Make sure shoes fit well. Choose the right shoe for the sport or activity.  Protect your knees. Wear kneepads if needed.  Rest when you are tired. GET HELP RIGHT AWAY IF:   Your knee pain does not stop.  Your knee pain does not get better.  Your knee joint feels hot to the touch.  You have a fever. MAKE SURE YOU:   Understand these instructions.  Will watch this condition.  Will get help right away if you are not doing well or get worse. Document Released: 12/11/2008 Document Revised: 12/07/2011 Document Reviewed: 12/11/2008 [facility] Patient Information 2014 [place], [place]. RICE: Routine Care for Injuries The routine care of many injuries includes Rest, Ice, Compression, and Elevation (RICE). HOME CARE INSTRUCTIONS  Rest is needed to allow your body to heal. Routine activities can usually be resumed when comfortable. Injured tendons and bones can take up to 6 weeks to heal. Tendons are the cord-like structures that attach muscle to bone.  Ice following an injury helps keep the swelling down and reduces pain.  Put ice in a plastic bag.  Place a towel between your skin and the  bag.  Leave the ice on for 15-20 minutes, 03-04 times a day. Do this while awake, for the first 24 to 48 hours. After that, continue as directed by your caregiver.  Compression helps keep swelling down. It also gives support and helps with discomfort. If an elastic bandage has been applied, it should be removed and reapplied every 3 to 4 hours. It should not be applied tightly, but firmly enough to keep swelling down. Watch fingers or toes for swelling, bluish discoloration, coldness, numbness, or excessive pain. If any of these problems occur, remove the bandage and reapply loosely. Contact your caregiver if these problems continue.  Elevation helps reduce swelling and decreases pain. With extremities, such as the arms, hands, legs, and feet, the injured area should be placed near or above the level of the heart, if possible. SEEK IMMEDIATE MEDICAL CARE IF:  You have persistent pain and swelling.  You develop redness, numbness, or unexpected weakness.  Your symptoms are getting worse rather than improving after several days. These symptoms may indicate that further evaluation or further X-rays are needed. Sometimes, X-rays may not show a small broken bone (fracture) until 1 week or 10 days later. Make a follow-up appointment with your caregiver. Ask when your X-ray results will be ready. Make sure you get your X-ray results. Document Released: 12/27/2000 Document Revised: 12/07/2011 Document Reviewed: 02/13/2011 [facility] Patient Information 2014 [place], [place].

## 2013-12-08 NOTE — ED Provider Notes (Signed)
CSN: [id number]     Arrival date & time 12/08/13  1259 History  This chart was scribed for non-physician practitioner Francee PiccoloJennifer Grasiela Jonsson, PA-C working with Toy BakerAnthony T Allen, MD by Joaquin MusicKristina Sanchez-Matthews, ED Scribe. This patient was seen in room TR04C/TR04C and the patient's care was started at 2:54 PM .   Chief Complaint  Patient presents with  . Knee Pain   The history is provided by the patient. No language interpreter was used.  HPI Comments: Ricky Cohen is a 28 y.o. male who presents to the Emergency Department complaining of ongoing worsening L knee pain that began this morning and has been getting progressively more painful since this AM. Pt states he woke up this morning with intensifying pain that he describes as throbbing and stabbing with radiation down his leg. He states he generally runs and reports running yesterday evening but denies having any pain yesterday evening. He denies hearing any clicking or popping to knee. Pt states he injured his meniscus to his L knee 10 years ago while playing baseball. He denies having any pain similar to his current pain. Pt denies taking any OTC medications. Pt denies any falls or injuries. Pt denies fever and chills.    History reviewed. No pertinent past medical history. History reviewed. No pertinent past surgical history. Family History  Problem Relation Age of Onset  . Diabetes Mother   . Hyperlipidemia Mother   . Hypertension Mother   . Heart attack Neg Hx   . Sudden death Neg Hx    History  Substance Use Topics  . Smoking status: Former Smoker    Types: Cigarettes  . Smokeless tobacco: Never Used  . Alcohol Use: Yes     Comment: socially    Review of Systems  Constitutional: Negative for fever.  Musculoskeletal: Positive for arthralgias and myalgias.  All other systems reviewed and are negative.   Allergies  Review of patient's allergies indicates no known allergies.  Home Medications   Current Outpatient Rx   Name  Route  Sig  Dispense  Refill  . ibuprofen (ADVIL,MOTRIN) 200 MG tablet   Oral   Take 800 mg by mouth every 6 (six) hours as needed for pain.         Marland Kitchen. penicillin v potassium (VEETID) 500 MG tablet   Oral   Take 1 tablet (500 mg total) by mouth 4 (four) times daily.   40 tablet   0    BP 131/68  Pulse 67  Temp(Src) 98.3 F (36.8 C) (Oral)  Resp 20  Ht 6' (1.829 m)  Wt 257 lb (116.574 kg)  BMI 34.85 kg/m2  SpO2 97% Physical Exam  Constitutional: He is oriented to person, place, and time. He appears well-developed and well-nourished. No distress.  HENT:  Head: Normocephalic and atraumatic.  Right Ear: External ear normal.  Left Ear: External ear normal.  Nose: Nose normal.  Mouth/Throat: Oropharynx is clear and moist.  Eyes: Conjunctivae are normal.  Neck: Normal range of motion. Neck supple.  Cardiovascular: Normal rate, regular rhythm, normal heart sounds and intact distal pulses.   Pulmonary/Chest: Effort normal and breath sounds normal.  Abdominal: Soft.  Musculoskeletal: He exhibits no edema.       Right knee: Normal.       Left knee: He exhibits normal range of motion, no swelling, no effusion, no ecchymosis, no deformity, no laceration, no erythema, normal alignment, no LCL laxity, no bony tenderness and normal meniscus. Tenderness found.  Right ankle: Normal.       Left ankle: Normal.       Right upper leg: Normal.       Left upper leg: Normal.       Right lower leg: Normal.       Left lower leg: Normal.  Neurological: He is alert and oriented to person, place, and time. No sensory deficit. GCS eye subscore is 4. GCS verbal subscore is 5. GCS motor subscore is 6.  Skin: Skin is warm and dry. He is not diaphoretic.  Psychiatric: His affect is angry.    ED Course  Procedures Medications  acetaminophen-codeine (TYLENOL #3) 300-30 MG per tablet 1 tablet (1 tablet Oral Given 12/08/13 1507)    DIAGNOSTIC STUDIES: Oxygen Saturation is 97% on RA,  normal by my interpretation.    COORDINATION OF CARE: 2:58 PM-Discussed treatment plan which includes discuss radiology findings and discharge pt with pain medication. Pt agreed to plan.   Labs Review Labs Reviewed - No data to display Imaging Review Dg Knee Complete 4 Views Left  12/08/2013   CLINICAL DATA:  Knee pain  EXAM: LEFT KNEE - COMPLETE 4+ VIEW  COMPARISON:  January 03, 2012  FINDINGS: There is no evidence of fracture, dislocation, or joint effusion. There is mild narrowing of the medial femoral tibial compartment. Soft tissues are unremarkable.  IMPRESSION: No acute fracture or dislocation.   Electronically Signed   By: Sherian Rein M.D.   On: 12/08/2013 13:55     EKG Interpretation None     MDM   Final diagnoses:  Left knee pain     Filed Vitals:   12/08/13 1525  BP: 140/82  Pulse: 72  Temp: 98.2 F (36.8 C)  Resp: 18    Afebrile, NAD, non-toxic appearing, AAOx4. Neurovascularly intact. Normal sensation. No gross abnormality or deformity noted. Imaging shows no fracture. Directed pt to ice injury, take acetaminophen or ibuprofen for pain, and to elevate and rest the injury when possible. Pain medication prescribed. Crutches and knee sleeve offered to patient. Advised ortho f/u if not improving. Patient is angered that there is no definitive answer for his pain, but agreeable to plan. Patient is stable at time of discharge    3:38 PM Patient exited the ED angry about not receiving stronger pain medications than Tylenol #3. Patient was witnessed walking without difficulty ambulating on his left knee, despite complaints of inability to ambulate during examination.   I personally performed the services described in this documentation, which was scribed in my presence. The recorded information has been reviewed and is accurate.     Jeannetta Ellis, PA-C 12/08/13 1542

## 2013-12-11 NOTE — ED Provider Notes (Signed)
Medical screening examination/treatment/procedure(s) were performed by non-physician practitioner and as supervising physician I was immediately available for consultation/collaboration.   Danarius T Edsel Shives, MD 12/11/13 0743 

## 2014-10-31 ENCOUNTER — Encounter (HOSPITAL_COMMUNITY): Payer: Self-pay | Admitting: Physical Medicine and Rehabilitation

## 2014-10-31 ENCOUNTER — Emergency Department (HOSPITAL_COMMUNITY)
Admission: EM | Admit: 2014-10-31 | Discharge: 2014-10-31 | Disposition: A | Discharge status: Home / Self Care | Payer: Medicaid Other | Attending: Emergency Medicine | Admitting: Emergency Medicine

## 2014-10-31 DIAGNOSIS — Z791 Long term (current) use of non-steroidal anti-inflammatories (NSAID): Secondary | ICD-10-CM | POA: Diagnosis not present

## 2014-10-31 DIAGNOSIS — R21 Rash and other nonspecific skin eruption: Secondary | ICD-10-CM | POA: Diagnosis not present

## 2014-10-31 DIAGNOSIS — M722 Plantar fascial fibromatosis: Secondary | ICD-10-CM | POA: Diagnosis not present

## 2014-10-31 DIAGNOSIS — Z87891 Personal history of nicotine dependence: Secondary | ICD-10-CM | POA: Insufficient documentation

## 2014-10-31 DIAGNOSIS — M79671 Pain in right foot: Secondary | ICD-10-CM | POA: Diagnosis present

## 2014-10-31 MED ORDER — MELOXICAM 15 MG PO TABS: 15.0000 mg | ORAL_TABLET | Freq: Every day | ORAL | Status: DC

## 2014-10-31 MED ORDER — HYDROMORPHONE HCL 1 MG/ML IJ SOLN: 1.0000 mg | Freq: Once | INTRAMUSCULAR | Status: DC

## 2014-10-31 NOTE — Discharge Instructions (Signed)
Your test for syphilis is pending, if returns abnormal, we will contact you. Wear well supported shoes, with high arch supports. Try ice massage. Follow up with your doctor. mobic for inflammation as prescribed.   Plantar Fasciitis Plantar fasciitis is a common condition that causes foot pain. It is soreness (inflammation) of the band of tough fibrous tissue on the bottom of the foot that runs from the heel bone (calcaneus) to the ball of the foot. The cause of this soreness may be from excessive standing, poor fitting shoes, running on hard surfaces, being overweight, having an abnormal walk, or overuse (this is common in runners) of the painful foot or feet. It is also common in aerobic exercise dancers and ballet dancers. SYMPTOMS  Most people with plantar fasciitis complain of:  Severe pain in the morning on the bottom of their foot especially when taking the first steps out of bed. This pain recedes after a few minutes of walking.  Severe pain is experienced also during walking following a long period of inactivity.  Pain is worse when walking barefoot or up stairs DIAGNOSIS   Your caregiver will diagnose this condition by examining and feeling your foot.  Special tests such as X-rays of your foot, are usually not needed. PREVENTION   Consult a sports medicine professional before beginning a new exercise program.  Walking programs offer a good workout. With walking there is a lower chance of overuse injuries common to runners. There is less impact and less jarring of the joints.  Begin all new exercise programs slowly. If problems or pain develop, decrease the amount of time or distance until you are at a comfortable level.  Wear good shoes and replace them regularly.  Stretch your foot and the heel cords at the back of the ankle (Achilles tendon) both before and after exercise.  Run or exercise on even surfaces that are not hard. For example, asphalt is better than pavement.  Do  not run barefoot on hard surfaces.  If using a treadmill, vary the incline.  Do not continue to workout if you have foot or joint problems. Seek professional help if they do not improve. HOME CARE INSTRUCTIONS   Avoid activities that cause you pain until you recover.  Use ice or cold packs on the problem or painful areas after working out.  Only take over-the-counter or prescription medicines for pain, discomfort, or fever as directed by your caregiver.  Soft shoe inserts or athletic shoes with air or gel sole cushions may be helpful.  If problems continue or become more severe, consult a sports medicine caregiver or your own health care provider. Cortisone is a potent anti-inflammatory medication that may be injected into the painful area. You can discuss this treatment with your caregiver. MAKE SURE YOU:   Understand these instructions.  Will watch your condition.  Will get help right away if you are not doing well or get worse. Document Released: 06/09/2001 Document Revised: 12/07/2011 Document Reviewed: 08/08/2008 [place] Patient Information 2015 [place], [place]. This information is not intended to replace advice given to you by your health care provider. Make sure you discuss any questions you have with your health care provider.

## 2014-10-31 NOTE — ED Notes (Signed)
Pt states rash and itching to both hands and pain to bilateral feet. Ongoing x1 month. NAD.

## 2014-10-31 NOTE — ED Provider Notes (Signed)
CSN: [id number]     Arrival date & time 10/31/14  0913 History  This chart was scribed for non-physician practitioner working with Flint Melter, MD by Richarda Overlie, ED Scribe. This patient was seen in room TR08C/TR08C and the patient's care was started at 9:50 AM.      Chief Complaint  Patient presents with  . Rash  . Foot Pain   The history is provided by the patient. No language interpreter was used.   HPI Comments: Ricky Cohen is a 29 y.o. male who presents to the Emergency Department complaining of an intermittent rash on his bilateral hands for the last couple months. Pt reports associated itching to the area and says that the area feels dry. He reports that the rash is in no other locations. He states that he has made an appointment with his doctor for next month. Pt states he does not work with any chemicals at work. He reports no pertinent past medical history at this time.   Pt also complains of bilateral foot pain under his heels. He states that his feet are normal in the morning but after standing on them throughout the day experiences increasing pain. Pt states that he recently got foot inserts but they have failed to relieve his pain.   History reviewed. No pertinent past medical history. History reviewed. No pertinent past surgical history. Family History  Problem Relation Age of Onset  . Diabetes Mother   . Hyperlipidemia Mother   . Hypertension Mother   . Heart attack Neg Hx   . Sudden death Neg Hx    History  Substance Use Topics  . Smoking status: Former Smoker    Types: Cigarettes  . Smokeless tobacco: Never Used  . Alcohol Use: Yes     Comment: socially    Review of Systems  Musculoskeletal: Positive for myalgias.  Skin: Positive for rash.   Allergies  Review of patient's allergies indicates no known allergies.  Home Medications   Prior to Admission medications   Medication Sig Start Date End Date Taking? Authorizing Provider   acetaminophen-codeine (TYLENOL #3) 300-30 MG per tablet Take 1-2 tablets by mouth every 6 (six) hours as needed for severe pain. 12/08/13   Jennifer L Piepenbrink, PA-C  aspirin-acetaminophen-caffeine (EXCEDRIN MIGRAINE) [phone] MG per tablet Take 1-2 tablets by mouth every 6 (six) hours as needed for headache.    Historical Provider, MD  meloxicam (MOBIC) 7.5 MG tablet Take 2 tablets (15 mg total) by mouth daily. 12/08/13   Jennifer L Piepenbrink, PA-C   BP 141/83 mmHg  Pulse 69  Temp(Src) 98.8 F (37.1 C) (Oral)  Resp 18  SpO2 99% Physical Exam  Constitutional: He is oriented to person, place, and time. He appears well-developed and well-nourished.  HENT:  Head: Normocephalic and atraumatic.  Eyes: Right eye exhibits no discharge. Left eye exhibits no discharge.  Neck: Neck supple. No tracheal deviation present.  Cardiovascular: Normal rate.   Pulmonary/Chest: Effort normal. No respiratory distress.  Abdominal: He exhibits no distension.  Musculoskeletal:  Tenderness of her bilateral calcaneus and arch of the feet. Full range of motion of the ankles. Pain with toes extension.    Neurological: He is alert and oriented to person, place, and time.  Skin: Skin is warm and dry.  No rash seen on hands  Psychiatric: He has a normal mood and affect. His behavior is normal.  Nursing note and vitals reviewed.   ED Course  Procedures   DIAGNOSTIC STUDIES: Oxygen  Saturation is 99% on RA, normal by my interpretation.    COORDINATION OF CARE: 9:55 AM Discussed treatment plan with pt at bedside and pt agreed to plan.   Labs Review Labs Reviewed - No data to display  Imaging Review No results found.   EKG Interpretation None      MDM   Final diagnoses:  Rash of hands  Plantar fasciitis, bilateral   Patient with nonspecific rash to the hands, and today's exam I do not see any rash, patient states he did improve. Specifically no evidence of scabies, no hand foot mouth  disease, RPR obtained and pending. Exam is consistent with plantar fasciitis. Advised to get orthotic shoe inserts, wear more supportive shoes. Ice water massage. Follow-up with podiatry or primary care doctor.  Filed Vitals:   10/31/14 0923  BP: 141/83  Pulse: 69  Temp: 98.8 F (37.1 C)  TempSrc: Oral  Resp: 18  SpO2: 99%   I personally performed the services described in this documentation, which was scribed in my presence. The recorded information has been reviewed and is accurate.      Lottie Musselatyana A Tyree Fluharty, PA-C 10/31/14 1021  Flint MelterElliott L Wentz, MD 11/01/14 [phone]

## 2014-11-01 LAB — RPR: RPR: NONREACTIVE

## 2014-12-06 ENCOUNTER — Encounter (HOSPITAL_COMMUNITY): Payer: Self-pay | Admitting: Emergency Medicine

## 2014-12-06 ENCOUNTER — Emergency Department (HOSPITAL_COMMUNITY)
Admission: EM | Admit: 2014-12-06 | Discharge: 2014-12-06 | Disposition: A | Discharge status: Home / Self Care | Payer: Medicaid Other | Source: Home / Self Care | Attending: Family Medicine | Admitting: Family Medicine

## 2014-12-06 DIAGNOSIS — B009 Herpesviral infection, unspecified: Secondary | ICD-10-CM

## 2014-12-06 HISTORY — DX: Herpesviral infection, unspecified: B00.9

## 2014-12-06 MED ORDER — ACYCLOVIR 400 MG PO TABS: 400.0000 mg | ORAL_TABLET | Freq: Three times a day (TID) | ORAL | Status: DC

## 2014-12-06 NOTE — ED Notes (Signed)
Discharge instructions provided. Pt given Rx for Acyclovir. Instructed to abstain from sex until healed and to use protection in the future. [facility]

## 2014-12-06 NOTE — Discharge Instructions (Signed)
Herpes Simplex Herpes simplex is generally classified as Type 1 or Type 2. Type 1 is generally the type that is responsible for cold sores. Type 2 is generally associated with sexually transmitted diseases. We now know that most of the thoughts on these viruses are inaccurate. We find that HSV1 is also present genitally and HSV2 can be present orally, but this will vary in different locations of the world. Herpes simplex is usually detected by doing a culture. Blood tests are also available for this virus; however, the accuracy is often not as good.  PREPARATION FOR TEST No preparation or fasting is necessary. NORMAL FINDINGS  No virus present  No HSV antigens or antibodies present Ranges for normal findings may vary among different laboratories and hospitals. You should always check with your doctor after having lab work or other tests done to discuss the meaning of your test results and whether your values are considered within normal limits. MEANING OF TEST  Your caregiver will go over the test results with you and discuss the importance and meaning of your results, as well as treatment options and the need for additional tests if necessary. OBTAINING THE TEST RESULTS  It is your responsibility to obtain your test results. Ask the lab or department performing the test when and how you will get your results. Document Released: 10/17/2004 Document Revised: 12/07/2011 Document Reviewed: 08/25/2008 [employer] Patient Information 2015 [employer]. This information is not intended to replace advice given to you by your health care provider. Make sure you discuss any questions you have with your health care provider.  

## 2014-12-06 NOTE — ED Notes (Signed)
Pt reports having a herpes break out x 3 days.  Requesting meds.  And question for possible suppressant therapy.  Unable to get in with primary.  Hx of herpes 1.

## 2014-12-06 NOTE — ED Provider Notes (Signed)
CSN: [id number]     Arrival date & time 12/06/14  1634 History   First MD Initiated Contact with Patient 12/06/14 1807     Chief Complaint  Patient presents with  . Skin Ulcer    herpes break out.    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 States he began with pruritic red rash on his penis 2-3 days ago and states that this rash is similar to outbreaks in the past. Requesting prescription for acyclovir.   The history is provided by the patient.    Past Medical History  Diagnosis Date  . Herpes    History reviewed. No pertinent past surgical history. Family History  Problem Relation Age of Onset  . Diabetes Mother   . Hyperlipidemia Mother   . Hypertension Mother   . Heart attack Neg Hx   . Sudden death Neg Hx    History  Substance Use Topics  . Smoking status: Former Smoker    Types: Cigarettes  . Smokeless tobacco: Never Used  . Alcohol Use: Yes     Comment: socially    Review of Systems  All other systems reviewed and are negative.   Allergies  Review of patient's allergies indicates no known allergies.  Home Medications   Prior to Admission medications   Medication Sig Start Date End Date Taking? Authorizing Provider  acetaminophen-codeine (TYLENOL #3) 300-30 MG per tablet Take 1-2 tablets by mouth every 6 (six) hours as needed for severe pain. 12/08/13   Alexzavier Girardin Piepenbrink, PA-C  acyclovir (ZOVIRAX) 400 MG tablet Take 1 tablet (400 mg total) by mouth 3 (three) times daily. X 5 days 12/06/14   Ria ClockJennifer Lee H Mamie Hundertmark, PA  aspirin-acetaminophen-caffeine (EXCEDRIN MIGRAINE) [phone] MG per tablet Take 1-2 tablets by mouth every 6 (six) hours as needed for headache.    Historical Provider, MD  meloxicam (MOBIC) 15 MG tablet Take 1 tablet (15 mg total) by mouth daily. 10/31/14   Tatyana Kirichenko, PA-C   BP 133/82 mmHg  Pulse 88   Temp(Src) 97.9 F (36.6 C) (Oral)  Resp 18  SpO2 98% Physical Exam  Constitutional: He is oriented to person, place, and time. He appears well-developed and well-nourished.  HENT:  Head: Normocephalic and atraumatic.  Cardiovascular: Normal rate.   Pulmonary/Chest: Effort normal.  Genitourinary: Testes normal.    Right testis shows no swelling and no tenderness. Left testis shows no swelling and no tenderness. Circumcised. No discharge found.  Region outlined contains scattered erythematous macules without blisters or ulceration. Minimal tenderness.   Musculoskeletal: Normal range of motion.  Neurological: He is alert and oriented to person, place, and time.  Skin: Skin is warm and dry.  Psychiatric: He has a normal mood and affect. His behavior is normal.  Nursing note and vitals reviewed.   ED Course  Procedures (including critical care time) Labs Review Labs Reviewed - No data to display  Imaging Review No results found.   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    Ria ClockJennifer Lee H Nikki Glanzer, [place] 12/06/14 [phone]

## 2015-01-25 ENCOUNTER — Encounter (HOSPITAL_COMMUNITY): Payer: Self-pay | Admitting: *Deleted

## 2015-01-25 ENCOUNTER — Emergency Department (HOSPITAL_COMMUNITY)
Admission: EM | Admit: 2015-01-25 | Discharge: 2015-01-25 | Disposition: A | Discharge status: Home / Self Care | Payer: Medicaid Other | Attending: Emergency Medicine | Admitting: Emergency Medicine

## 2015-01-25 ENCOUNTER — Emergency Department (HOSPITAL_COMMUNITY): Payer: Medicaid Other

## 2015-01-25 DIAGNOSIS — Y9389 Activity, other specified: Secondary | ICD-10-CM | POA: Diagnosis not present

## 2015-01-25 DIAGNOSIS — S299XXA Unspecified injury of thorax, initial encounter: Secondary | ICD-10-CM | POA: Diagnosis not present

## 2015-01-25 DIAGNOSIS — Y998 Other external cause status: Secondary | ICD-10-CM | POA: Insufficient documentation

## 2015-01-25 DIAGNOSIS — Z791 Long term (current) use of non-steroidal anti-inflammatories (NSAID): Secondary | ICD-10-CM | POA: Insufficient documentation

## 2015-01-25 DIAGNOSIS — S3992XA Unspecified injury of lower back, initial encounter: Secondary | ICD-10-CM | POA: Insufficient documentation

## 2015-01-25 DIAGNOSIS — Z79899 Other long term (current) drug therapy: Secondary | ICD-10-CM | POA: Diagnosis not present

## 2015-01-25 DIAGNOSIS — Z87891 Personal history of nicotine dependence: Secondary | ICD-10-CM | POA: Diagnosis not present

## 2015-01-25 DIAGNOSIS — Z8619 Personal history of other infectious and parasitic diseases: Secondary | ICD-10-CM | POA: Diagnosis not present

## 2015-01-25 DIAGNOSIS — Y92410 Unspecified street and highway as the place of occurrence of the external cause: Secondary | ICD-10-CM | POA: Insufficient documentation

## 2015-01-25 DIAGNOSIS — S199XXA Unspecified injury of neck, initial encounter: Secondary | ICD-10-CM | POA: Insufficient documentation

## 2015-01-25 DIAGNOSIS — M542 Cervicalgia: Secondary | ICD-10-CM

## 2015-01-25 MED ORDER — NAPROXEN 500 MG PO TABS: 500.0000 mg | ORAL_TABLET | Freq: Two times a day (BID) | ORAL | Status: DC

## 2015-01-25 MED ORDER — CYCLOBENZAPRINE HCL 10 MG PO TABS: 10.0000 mg | ORAL_TABLET | Freq: Two times a day (BID) | ORAL | Status: DC | PRN
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
## 2015-01-25 NOTE — ED Notes (Signed)
Pt A&OX4, ambulatory at d/c with steady gait, NAD 
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
## 2015-01-25 NOTE — Discharge Instructions (Signed)

## 2015-02-17 ENCOUNTER — Other Ambulatory Visit (HOSPITAL_COMMUNITY): Payer: Self-pay | Admitting: Chiropractic Medicine

## 2015-02-17 ENCOUNTER — Ambulatory Visit (HOSPITAL_COMMUNITY)
Admission: RE | Admit: 2015-02-17 | Discharge: 2015-02-17 | Disposition: A | Discharge status: Home / Self Care | Payer: Medicaid Other | Source: Ambulatory Visit | Attending: Chiropractic Medicine | Admitting: Chiropractic Medicine

## 2015-02-17 DIAGNOSIS — M545 Low back pain: Secondary | ICD-10-CM | POA: Diagnosis not present

## 2015-02-17 DIAGNOSIS — R52 Pain, unspecified: Secondary | ICD-10-CM

## 2017-02-19 ENCOUNTER — Emergency Department (HOSPITAL_BASED_OUTPATIENT_CLINIC_OR_DEPARTMENT_OTHER)
Admission: EM | Admit: 2017-02-19 | Discharge: 2017-02-19 | Disposition: A | Discharge status: Home / Self Care | Payer: Medicaid Other | Attending: Emergency Medicine | Admitting: Emergency Medicine

## 2017-02-19 ENCOUNTER — Encounter (HOSPITAL_BASED_OUTPATIENT_CLINIC_OR_DEPARTMENT_OTHER): Payer: Self-pay

## 2017-02-19 DIAGNOSIS — Z91038 Other insect allergy status: Secondary | ICD-10-CM

## 2017-02-19 DIAGNOSIS — T7840XA Allergy, unspecified, initial encounter: Secondary | ICD-10-CM | POA: Insufficient documentation

## 2017-02-19 DIAGNOSIS — Z87891 Personal history of nicotine dependence: Secondary | ICD-10-CM | POA: Insufficient documentation

## 2017-02-19 MED ORDER — PREDNISONE 20 MG PO TABS: ORAL_TABLET | ORAL | 0 refills | Status: DC

## 2017-02-19 MED ORDER — HYDROXYZINE HCL 25 MG PO TABS: 25.0000 mg | ORAL_TABLET | Freq: Four times a day (QID) | ORAL | 0 refills | Status: DC | PRN

## 2017-02-19 NOTE — ED Provider Notes (Signed)
[facility]-EMERGENCY DEPT Provider Note   CSN: [id number] Arrival date & time: 02/19/17  2105  By signing my name below, I, Paschal Dopp and Doreatha Martin, attest that this documentation has been prepared under the direction and in the presence of Loren Racer, MD . Electronically Signed: Paschal Dopp and Doreatha Martin, Scribe. 02/19/2017. 10:47 PM.   History   Chief Complaint Chief Complaint  Patient presents with  . Rash    The history is provided by the patient. No language interpreter was used.   HPI Comments:  Ricky Cohen is a 31 y.o. male who presents to the Emergency Department complaining of constant, pruritic hive-like rash to the back, arms, legs and abdomen that began 3 days ago. Pt states he just moved into a new home, but has not seen any pests and checked his dog for fleas. No other relative in home is affected. He has tried OTC anti-itch cream with no relief. He denies rash on the hands or groin.   Past Medical History:  Diagnosis Date  . Herpes     Patient Active Problem List   Diagnosis Date Noted  . Left shoulder pain 09/23/2012    History reviewed. No pertinent surgical history.     Home Medications    Prior to Admission medications   Medication Sig Start Date End Date Taking? Authorizing Provider  hydrOXYzine (ATARAX/VISTARIL) 25 MG tablet Take 1 tablet (25 mg total) by mouth every 6 (six) hours as needed for itching. 02/19/17   Loren Racer, MD  predniSONE (DELTASONE) 20 MG tablet 3 tabs po day one, then 2 po daily x 4 days 02/19/17   Loren Racer, MD    Family History Family History  Problem Relation Age of Onset  . Diabetes Mother   . Hyperlipidemia Mother   . Hypertension Mother   . Heart attack Neg Hx   . Sudden death Neg Hx     Social History Social History  Substance Use Topics  . Smoking status: Former Smoker    Types: Cigarettes  . Smokeless tobacco: Never Used  . Alcohol use Yes     Comment: occ     Allergies    Penicillins   Review of Systems Review of Systems  Constitutional: Negative for chills, fatigue and fever.  HENT: Negative for congestion, facial swelling, sore throat and trouble swallowing.   Gastrointestinal: Negative for abdominal pain, diarrhea, nausea and vomiting.  Musculoskeletal: Negative for back pain, myalgias, neck pain and neck stiffness.  Skin: Positive for rash.  Neurological: Negative for dizziness, weakness, light-headedness, numbness and headaches.  All other systems reviewed and are negative.    Physical Exam Updated Vital Signs BP 140/83 (BP Location: Left Arm)   Pulse 71   Temp 97.8 F (36.6 C) (Oral)   Resp 18   Ht 6' (1.829 m)   Wt 96.8 kg (213 lb 6.4 oz)   SpO2 100%   BMI 28.94 kg/m   Physical Exam  Constitutional: He is oriented to person, place, and time. He appears well-developed and well-nourished.  HENT:  Head: Normocephalic and atraumatic.  Mouth/Throat: Oropharynx is clear and moist.  No intraoral lesions  Eyes: EOM are normal. Pupils are equal, round, and reactive to light.  Neck: Normal range of motion. Neck supple.  Cardiovascular: Normal rate and regular rhythm.   Pulmonary/Chest: Effort normal and breath sounds normal.  Abdominal: Soft. Bowel sounds are normal. There is no tenderness. There is no rebound and no guarding.  Musculoskeletal: Normal range of  motion. He exhibits no edema or tenderness.  Neurological: He is alert and oriented to person, place, and time.  Skin: Skin is warm and dry. Rash noted. No erythema.  Patient with multiple papular lesions with surrounding erythema. Central punctate lesion. Appeared to be clustered mostly in the trunk and extremities. Consistent with insect stings or bites with surrounding inflammatory reaction.  Psychiatric: He has a normal mood and affect. His behavior is normal.  Nursing note and vitals reviewed.    ED Treatments / Results  DIAGNOSTIC STUDIES:  Oxygen Saturation is 100% on RA,  normal by my interpretation.    COORDINATION OF CARE:  10:26 PM Discussed treatment plan with pt at bedside and pt agreed to plan.  Labs (all labs ordered are listed, but only abnormal results are displayed) Labs Reviewed - No data to display  EKG  EKG Interpretation None       Radiology No results found.  Procedures Procedures (including critical care time)  Medications Ordered in ED Medications - No data to display   Initial Impression / Assessment and Plan / ED Course  I have reviewed the triage vital signs and the nursing notes.  Pertinent labs & imaging results that were available during my care of the patient were reviewed by me and considered in my medical decision making (see chart for details).   Low suspicion for infectious process. Advised to have pest control specialist evaluate living arrangements.   Final Clinical Impressions(s) / ED Diagnoses   Final diagnoses:  Allergic reaction to insect bite    New Prescriptions Discharge Medication List as of 02/19/2017 10:48 PM    START taking these medications   Details  hydrOXYzine (ATARAX/VISTARIL) 25 MG tablet Take 1 tablet (25 mg total) by mouth every 6 (six) hours as needed for itching., Starting Fri 02/19/2017, Print    predniSONE (DELTASONE) 20 MG tablet 3 tabs po day one, then 2 po daily x 4 days, Print      I personally performed the services described in this documentation, which was scribed in my presence. The recorded information has been reviewed and is accurate.       Loren RacerYelverton, Noelly Lasseigne, MD 02/23/17 [phone]

## 2017-02-19 NOTE — ED Triage Notes (Signed)
Scattered rash x 2 days-NAD-steady gait

## 2017-02-19 NOTE — ED Notes (Signed)
Pt verbalizes understanding of d/c instructions and denies any further needs at this time. 

## 2019-02-17 ENCOUNTER — Encounter (HOSPITAL_BASED_OUTPATIENT_CLINIC_OR_DEPARTMENT_OTHER): Payer: Self-pay | Admitting: Emergency Medicine

## 2019-02-17 ENCOUNTER — Other Ambulatory Visit: Payer: Self-pay

## 2019-02-17 ENCOUNTER — Emergency Department (HOSPITAL_BASED_OUTPATIENT_CLINIC_OR_DEPARTMENT_OTHER)
Admission: EM | Admit: 2019-02-17 | Discharge: 2019-02-17 | Disposition: A | Discharge status: Home / Self Care | Payer: Self-pay | Attending: Emergency Medicine | Admitting: Emergency Medicine

## 2019-02-17 DIAGNOSIS — L255 Unspecified contact dermatitis due to plants, except food: Secondary | ICD-10-CM | POA: Insufficient documentation

## 2019-02-17 DIAGNOSIS — Z87891 Personal history of nicotine dependence: Secondary | ICD-10-CM | POA: Insufficient documentation

## 2019-02-17 MED ORDER — PREDNISONE 10 MG (21) PO TBPK: ORAL_TABLET | ORAL | 0 refills | Status: AC

## 2019-02-17 MED ORDER — PREDNISONE 50 MG PO TABS
60.0000 mg | ORAL_TABLET | Freq: Once | ORAL | Status: AC
  Administered 2019-02-17: 60 mg via ORAL
  Filled 2019-02-17: qty 1

## 2019-02-17 MED ORDER — HYDROXYZINE HCL 25 MG PO TABS: 25.0000 mg | ORAL_TABLET | Freq: Four times a day (QID) | ORAL | 0 refills | Status: AC | PRN

## 2019-02-17 NOTE — Discharge Instructions (Addendum)
I have given you a prescription for steroids today.  Some common side effects include feelings of extra energy, feeling warm, increased appetite, and stomach upset.  If you are diabetic your sugars may run higher than usual.   You may use calamine lotion to help dry up the weeping areas.  You were given your first dose of steroids in the emergency room today.  Please do not start taking your prescription for approximately 24 hours after your dose in the ER.

## 2019-02-17 NOTE — ED Provider Notes (Signed)
[facility] EMERGENCY DEPARTMENT Provider Note   CSN: [id number] Arrival date & time: 02/17/19  2126    History   Chief Complaint Chief Complaint  Patient presents with  . Allergic Reaction    HPI Ricky Cohen is a 33 y.o. male with no significant past medical history who presents today for evaluation of a rash.  He reports that 3 days ago that the air conditioner was having problems and he attempted to fix it.  Since then he has had a spreading pruritic weeping rash on his bilateral arms, neck with a spot on his head.  He denies any shortness of breath or difficulty breathing.  He reports that he is allergic to poison ivy and has required p.o. steroids in the past.  None of his household contacts have similar rashes.  He has tried calamine lotion without significant relief of symptoms.     HPI  Past Medical History:  Diagnosis Date  . Herpes     Patient Active Problem List   Diagnosis Date Noted  . Left shoulder pain 09/23/2012    Past Surgical History:  Procedure Laterality Date  . HIP SURGERY          Home Medications    Prior to Admission medications   Medication Sig Start Date End Date Taking? Authorizing Provider  hydrOXYzine (ATARAX/VISTARIL) 25 MG tablet Take 1 tablet (25 mg total) by mouth every 6 (six) hours as needed for itching. 02/17/19   Cristina Gong, PA-C  predniSONE (STERAPRED UNI-PAK 21 TAB) 10 MG (21) TBPK tablet Take 6 tabs by mouth daily  for 2 days, then 5 tabs for 2 days, then 4 tabs for 2 days, then 3 tabs for 2 days, 2 tabs for 2 days, then 1 tab by mouth daily for 2 days 02/17/19   Cristina Gong, PA-C    Family History Family History  Problem Relation Age of Onset  . Diabetes Mother   . Hyperlipidemia Mother   . Hypertension Mother   . Heart attack Neg Hx   . Sudden death Neg Hx     Social History Social History   Tobacco Use  . Smoking status: Former Smoker    Types: Cigarettes  . Smokeless  tobacco: Never Used  Substance Use Topics  . Alcohol use: Yes    Comment: occ  . Drug use: Yes    Types: Marijuana     Allergies   Penicillins   Review of Systems Review of Systems  Constitutional: Negative for chills and fever.  Musculoskeletal: Negative for neck pain.  Skin: Positive for rash.  All other systems reviewed and are negative.    Physical Exam Updated Vital Signs BP (!) 127/94 (BP Location: Left Arm)   Pulse 86   Temp 98.1 F (36.7 C) (Oral)   Resp 18   Ht 6' (1.829 m)   Wt 102.1 kg   SpO2 100%   BMI 30.52 kg/m   Physical Exam Vitals signs and nursing note reviewed.  Constitutional:      General: He is not in acute distress. HENT:     Head: Normocephalic.     Mouth/Throat:     Mouth: Mucous membranes are moist.  Neck:     Musculoskeletal: Normal range of motion. No neck rigidity.  Cardiovascular:     Rate and Rhythm: Normal rate and regular rhythm.  Pulmonary:     Effort: Pulmonary effort is normal. No respiratory distress.  Skin:    General: Skin  is warm and dry.     Comments: Please see clinical image.  There are multiple areas of erythema with weeping vesicles on his bilateral upper extremities.  There is a patch on his left anterior forehead and on the back of his neck in addition.  No obvious burrows or secondary infection.  Neurological:     General: No focal deficit present.     Mental Status: He is alert.  Psychiatric:        Mood and Affect: Mood normal.          ED Treatments / Results  Labs (all labs ordered are listed, but only abnormal results are displayed) Labs Reviewed - No data to display  EKG None  Radiology No results found.  Procedures Procedures (including critical care time)  Medications Ordered in ED Medications  predniSONE (DELTASONE) tablet 60 mg (60 mg Oral Given 02/17/19 2301)     Initial Impression / Assessment and Plan / ED Course  I have reviewed the triage vital signs and the nursing  notes.  Pertinent labs & imaging results that were available during my care of the patient were reviewed by me and considered in my medical decision making (see chart for details).       Poison Ivy Pt presentation consistant with poison ivy infection. Discussed contagiousness & home care.  He is given prescription for Atarax at home.   Given RX for extended prednisone taper for 12 days of PO steroids.  Strict return precautions discussed. Pt afebrile and in NAD prior to dc. Airway intact without compromise. No facial or genital involvement of rash (only isolated area on forehead).      Final Clinical Impressions(s) / ED Diagnoses   Final diagnoses:  Dermatitis due to plants, including poison ivy, sumac, and oak    ED Discharge Orders         Ordered    predniSONE (STERAPRED UNI-PAK 21 TAB) 10 MG (21) TBPK tablet     02/17/19 2254    hydrOXYzine (ATARAX/VISTARIL) 25 MG tablet  Every 6 hours PRN     02/17/19 2255           Cristina GongHammond, Sheanna Dail W, Cordelia Poche-C 02/17/19 2339    Tegeler, Canary Brimhristopher J, MD 02/18/19 [phone]

## 2019-02-17 NOTE — ED Triage Notes (Signed)
Pt having an allergic reaction on his body mostly on his arms and hand related to poison ivy.

## 2019-02-17 NOTE — ED Notes (Signed)
ED Provider at bedside. 

## 2019-02-27 ENCOUNTER — Emergency Department (HOSPITAL_BASED_OUTPATIENT_CLINIC_OR_DEPARTMENT_OTHER): Payer: Self-pay

## 2019-02-27 ENCOUNTER — Encounter (HOSPITAL_BASED_OUTPATIENT_CLINIC_OR_DEPARTMENT_OTHER): Payer: Self-pay

## 2019-02-27 ENCOUNTER — Emergency Department (HOSPITAL_BASED_OUTPATIENT_CLINIC_OR_DEPARTMENT_OTHER)
Admission: EM | Admit: 2019-02-27 | Discharge: 2019-02-28 | Disposition: A | Discharge status: Home / Self Care | Payer: Self-pay | Attending: Emergency Medicine | Admitting: Emergency Medicine

## 2019-02-27 ENCOUNTER — Other Ambulatory Visit: Payer: Self-pay

## 2019-02-27 DIAGNOSIS — Z87891 Personal history of nicotine dependence: Secondary | ICD-10-CM | POA: Insufficient documentation

## 2019-02-27 DIAGNOSIS — M545 Low back pain, unspecified: Secondary | ICD-10-CM

## 2019-02-27 DIAGNOSIS — R410 Disorientation, unspecified: Secondary | ICD-10-CM | POA: Insufficient documentation

## 2019-02-27 LAB — CBC WITH DIFFERENTIAL/PLATELET
Abs Immature Granulocytes: 0.04 10*3/uL (ref 0.00–0.07)
Basophils Absolute: 0 10*3/uL (ref 0.0–0.1)
Basophils Relative: 0 %
Eosinophils Absolute: 0.1 10*3/uL (ref 0.0–0.5)
Eosinophils Relative: 1 %
HCT: 46.2 % (ref 39.0–52.0)
Hemoglobin: 15.1 g/dL (ref 13.0–17.0)
Immature Granulocytes: 0 %
Lymphocytes Relative: 26 %
Lymphs Abs: 2.5 10*3/uL (ref 0.7–4.0)
MCH: 28.7 pg (ref 26.0–34.0)
MCHC: 32.7 g/dL (ref 30.0–36.0)
MCV: 87.8 fL (ref 80.0–100.0)
Monocytes Absolute: 1 10*3/uL (ref 0.1–1.0)
Monocytes Relative: 10 %
Neutro Abs: 6 10*3/uL (ref 1.7–7.7)
Neutrophils Relative %: 63 %
Platelets: 156 10*3/uL (ref 150–400)
RBC: 5.26 MIL/uL (ref 4.22–5.81)
RDW: 13.9 % (ref 11.5–15.5)
WBC: 9.7 10*3/uL (ref 4.0–10.5)
nRBC: 0 % (ref 0.0–0.2)

## 2019-02-27 LAB — BASIC METABOLIC PANEL
Anion gap: 9 (ref 5–15)
BUN: 16 mg/dL (ref 6–20)
CO2: 25 mmol/L (ref 22–32)
Calcium: 9.3 mg/dL (ref 8.9–10.3)
Chloride: 104 mmol/L (ref 98–111)
Creatinine, Ser: 0.99 mg/dL (ref 0.61–1.24)
GFR calc Af Amer: >60 mL/min (ref >60)
GFR calc non Af Amer: >60 mL/min (ref >60)
Glucose, Bld: 103 mg/dL — ABNORMAL HIGH (ref 70–99)
Potassium: 3.6 mmol/L (ref 3.5–5.1)
Sodium: 138 mmol/L (ref 135–145)

## 2019-02-27 LAB — CBG MONITORING, ED: Glucose-Capillary: 103 mg/dL — ABNORMAL HIGH (ref 70–99)

## 2019-02-27 NOTE — ED Triage Notes (Signed)
Pt had been on a tapering dose of steroids and has two days left of doses. Pt started having back pain, and generalized weakness this evening.

## 2019-02-27 NOTE — ED Provider Notes (Signed)
[facility] EMERGENCY DEPARTMENT Provider Note   CSN: [id number] Arrival date & time: 02/27/19  2225    History   Chief Complaint Chief Complaint  Patient presents with  . Back Pain    HPI Ricky Cohen is a 33 y.o. male.     The history is provided by the patient.  Back Pain  Pain location: B low back not midline. Quality:  Stabbing Radiates to:  Does not radiate Pain severity:  Severe Pain is:  Same all the time Onset quality:  Sudden Timing:  Constant Progression:  Unchanged Chronicity:  New Context: not emotional stress, not falling, not jumping from heights, not lifting heavy objects, not physical stress and not recent illness   Context comment:  He had PT earlier in the Day and was standing up when the incident occured.   Relieved by:  Nothing Worsened by:  Nothing Ineffective treatments:  None tried Associated symptoms: no abdominal pain, no abdominal swelling, no bladder incontinence, no bowel incontinence, no chest pain, no dysuria, no fever, no headaches, no numbness, no paresthesias, no pelvic pain, no perianal numbness, no tingling, no weakness and no weight loss   Associated symptoms comment:  Urinating and defecating no loss of sensation  Risk factors: no hx of cancer   Patient is on steroids for poison ivy and reports an episode of Left foot swelling and B lower back pain and then feeling disoriented this evening following the pain starting.  Disorientation lasted a few minutes pain persists and foot swelling went away.  No f/c/r. No CP, no cough, no sore throat, no anosmia.  No weakness nor numbness.  No changes in vision or speech.  He states he has had symptoms from the steroids since starting them.  They made him jittery when he took them at night.    Past Medical History:  Diagnosis Date  . Herpes     Patient Active Problem List   Diagnosis Date Noted  . Left shoulder pain 09/23/2012    Past Surgical History:  Procedure Laterality  Date  . HIP SURGERY          Home Medications    Prior to Admission medications   Medication Sig Start Date End Date Taking? Authorizing Provider  hydrOXYzine (ATARAX/VISTARIL) 25 MG tablet Take 1 tablet (25 mg total) by mouth every 6 (six) hours as needed for itching. 02/17/19   Cristina GongHammond, Elizabeth W, PA-C  predniSONE (STERAPRED UNI-PAK 21 TAB) 10 MG (21) TBPK tablet Take 6 tabs by mouth daily  for 2 days, then 5 tabs for 2 days, then 4 tabs for 2 days, then 3 tabs for 2 days, 2 tabs for 2 days, then 1 tab by mouth daily for 2 days 02/17/19   Cristina GongHammond, Elizabeth W, PA-C    Family History Family History  Problem Relation Age of Onset  . Diabetes Mother   . Hyperlipidemia Mother   . Hypertension Mother   . Heart attack Neg Hx   . Sudden death Neg Hx     Social History Social History   Tobacco Use  . Smoking status: Former Smoker    Types: Cigarettes  . Smokeless tobacco: Never Used  Substance Use Topics  . Alcohol use: Yes    Comment: occ  . Drug use: Yes    Types: Marijuana     Allergies   Penicillins   Review of Systems Review of Systems  Constitutional: Negative for diaphoresis, fever and weight loss.  Eyes: Negative for  visual disturbance.  Respiratory: Negative for cough and shortness of breath.   Cardiovascular: Negative for chest pain and palpitations.  Gastrointestinal: Negative for abdominal pain and bowel incontinence.  Genitourinary: Negative for bladder incontinence, dysuria and pelvic pain.  Musculoskeletal: Positive for back pain.  Neurological: Negative for dizziness, tingling, seizures, syncope, facial asymmetry, speech difficulty, weakness, numbness, headaches and paresthesias.  All other systems reviewed and are negative.    Physical Exam Updated Vital Signs BP 136/88 (BP Location: Right Arm)   Pulse 77   Temp 98.3 F (36.8 C)   Resp 12   Ht 6' (1.829 m)   Wt 102.1 kg   SpO2 95%   BMI 30.52 kg/m   Physical Exam Vitals signs and  nursing note reviewed.  Constitutional:      General: He is not in acute distress. HENT:     Head: Normocephalic and atraumatic.     Nose: Nose normal.     Mouth/Throat:     Mouth: Mucous membranes are moist.     Pharynx: Oropharynx is clear.  Eyes:     Extraocular Movements: Extraocular movements intact.     Conjunctiva/sclera: Conjunctivae normal.     Pupils: Pupils are equal, round, and reactive to light.  Neck:     Musculoskeletal: Normal range of motion and neck supple. No neck rigidity.  Cardiovascular:     Rate and Rhythm: Normal rate and regular rhythm.     Pulses: Normal pulses.     Heart sounds: Normal heart sounds.  Pulmonary:     Effort: Pulmonary effort is normal.     Breath sounds: Normal breath sounds.  Abdominal:     General: Abdomen is flat. Bowel sounds are normal.     Tenderness: There is no abdominal tenderness. There is no guarding.  Musculoskeletal: Normal range of motion.     Left hip: Normal.     Left knee: Normal.     Left ankle: Normal. Achilles tendon normal.     Cervical back: Normal.     Thoracic back: Normal.     Lumbar back: Normal.       Back:     Left upper leg: Normal.     Left lower leg: Normal.     Left foot: Normal.     Comments: No swelling of either feet 3+ dorsalis pedis B.  Negative Homan's sign, no calf tenderness.    Lymphadenopathy:     Cervical: No cervical adenopathy.  Skin:    General: Skin is warm and dry.     Comments: Crusted lesions on the dorsum of the right hand   Neurological:     General: No focal deficit present.     Mental Status: He is alert and oriented to person, place, and time.     Cranial Nerves: No cranial nerve deficit.     Sensory: No sensory deficit.     Coordination: Coordination normal.     Gait: Gait normal.     Deep Tendon Reflexes: Reflexes normal.     Comments: Gait witnessed and is normal   Psychiatric:        Mood and Affect: Mood normal.        Behavior: Behavior normal.      ED  Treatments / Results  Labs (all labs ordered are listed, but only abnormal results are displayed) Results for orders placed or performed during the hospital encounter of 02/27/19  CBC with Differential/Platelet  Result Value Ref Range   WBC 9.7 4.0 -  10.5 K/uL   RBC 5.26 4.22 - 5.81 MIL/uL   Hemoglobin 15.1 13.0 - 17.0 g/dL   HCT 29.5 62.1 - 30.8 %   MCV 87.8 80.0 - 100.0 fL   MCH 28.7 26.0 - 34.0 pg   MCHC 32.7 30.0 - 36.0 g/dL   RDW 65.7 84.6 - 96.2 %   Platelets 156 150 - 400 K/uL   nRBC 0.0 0.0 - 0.2 %   Neutrophils Relative % 63 %   Neutro Abs 6.0 1.7 - 7.7 K/uL   Lymphocytes Relative 26 %   Lymphs Abs 2.5 0.7 - 4.0 K/uL   Monocytes Relative 10 %   Monocytes Absolute 1.0 0.1 - 1.0 K/uL   Eosinophils Relative 1 %   Eosinophils Absolute 0.1 0.0 - 0.5 K/uL   Basophils Relative 0 %   Basophils Absolute 0.0 0.0 - 0.1 K/uL   Immature Granulocytes 0 %   Abs Immature Granulocytes 0.04 0.00 - 0.07 K/uL  Basic metabolic panel  Result Value Ref Range   Sodium 138 135 - 145 mmol/L   Potassium 3.6 3.5 - 5.1 mmol/L   Chloride 104 98 - 111 mmol/L   CO2 25 22 - 32 mmol/L   Glucose, Bld 103 (H) 70 - 99 mg/dL   BUN 16 6 - 20 mg/dL   Creatinine, Ser 9.52 0.61 - 1.24 mg/dL   Calcium 9.3 8.9 - 84.1 mg/dL   GFR calc non Af Amer >60 >60 mL/min   GFR calc Af Amer >60 >60 mL/min   Anion gap 9 5 - 15  POC CBG, ED  Result Value Ref Range   Glucose-Capillary 103 (H) 70 - 99 mg/dL   No results found.  EKG None  Radiology No results found.  Procedures Procedures (including critical care time)  Medications Ordered in ED Medications - No data to display    I suspect when the pain hit the patient had a vagal episode and was near syncopal.  Neuro exam is non focal.  There is no foot swelling but as patient had hip surgery on the left hip in February will set up for outpatient doppler in am.  I do not believe this is a stroke or acute neurologic condition.  The exam, labs, imaging are  unremarkable.  Final Clinical Impressions(s) / ED Diagnoses   Return for intractable cough, coughing up blood,fevers >100.4 unrelieved by medication, shortness of breath, intractable vomiting, chest pain, shortness of breath, weakness,numbness, changes in speech, facial asymmetry,abdominal pain, passing out,Inability to tolerate liquids or food, cough, altered mental status or any concerns. No signs of systemic illness or infection. The patient is nontoxic-appearing on exam and vital signs are within normal limits.   I have reviewed the triage vital signs and the nursing notes. Pertinent labs &imaging results that were available during my care of the patient were reviewed by me and considered in my medical decision making (see chart for details).  After history, exam, and medical workup I feel the patient has been appropriately medically screened and is safe for discharge home. Pertinent diagnoses were discussed with the patient. Patient was given return precautions   Peniel Hass, MD 02/28/19 0111

## 2019-02-28 ENCOUNTER — Ambulatory Visit (HOSPITAL_BASED_OUTPATIENT_CLINIC_OR_DEPARTMENT_OTHER): Admit: 2019-02-28 | Payer: Self-pay

## 2019-02-28 LAB — URINALYSIS, ROUTINE W REFLEX MICROSCOPIC
Bilirubin Urine: NEGATIVE
Glucose, UA: NEGATIVE mg/dL
Hgb urine dipstick: NEGATIVE
Ketones, ur: NEGATIVE mg/dL
Leukocytes,Ua: NEGATIVE
Nitrite: NEGATIVE
Protein, ur: NEGATIVE mg/dL
Specific Gravity, Urine: <1.005 — ABNORMAL LOW (ref 1.005–1.030)
pH: 6 (ref 5.0–8.0)

## 2019-02-28 MED ORDER — LIDOCAINE 5 % EX PTCH: 1.0000 | MEDICATED_PATCH | CUTANEOUS | 0 refills | Status: AC

## 2019-02-28 MED ORDER — KETOROLAC TROMETHAMINE 60 MG/2ML IM SOLN
30.0000 mg | Freq: Once | INTRAMUSCULAR | Status: AC
  Administered 2019-02-28: 30 mg via INTRAMUSCULAR
  Filled 2019-02-28: qty 2

## 2019-02-28 MED ORDER — METHOCARBAMOL 500 MG PO TABS
1000.0000 mg | ORAL_TABLET | Freq: Once | ORAL | Status: AC
  Administered 2019-02-28: 1000 mg via ORAL
  Filled 2019-02-28: qty 2

## 2019-08-24 IMAGING — CT CT HEAD WITHOUT CONTRAST
3 series · 15 of 47 positions shown, 18 images · non-contrast
Comparison: Head CT dated 03/14/2009

CLINICAL DATA: 32-year-old male with confusion and generalized
weakness.

EXAM:
CT HEAD WITHOUT CONTRAST
TECHNIQUE: Contiguous axial images were obtained from the base of the skull
through the vertex without intravenous contrast.

[Series 2: head wo · axial · 0.46mm/px · z∈[+1056,+1186]mm · 9 of 32 slices shown, 12 images]
[im 3/32  brain]
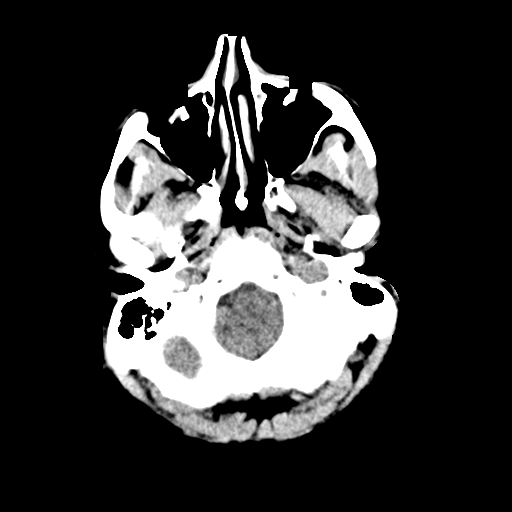
[im 3/32  bone]
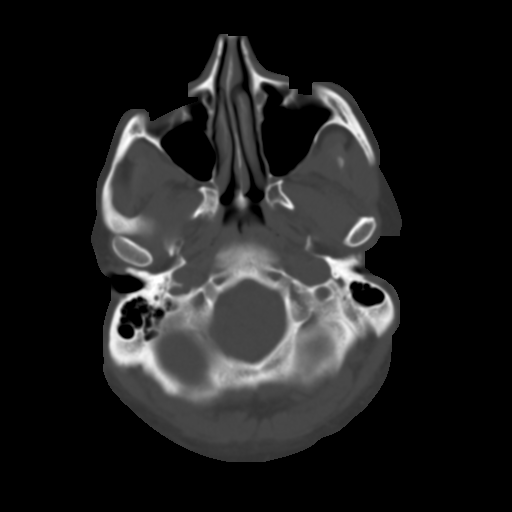
[im 6/32  brain]
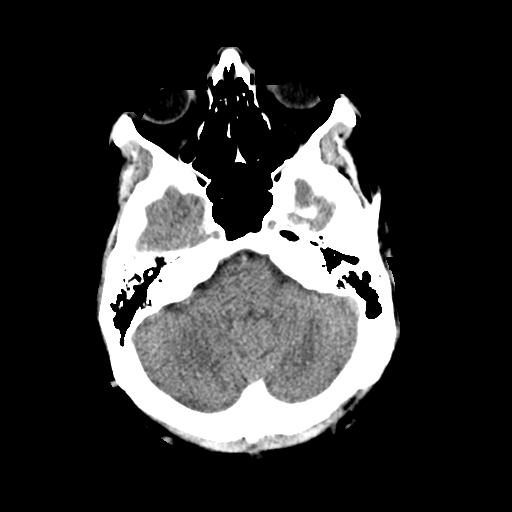
[im 9/32  brain]
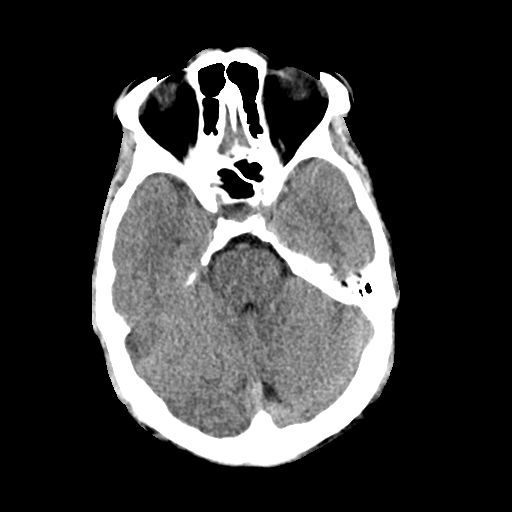
[im 12/32  brain]
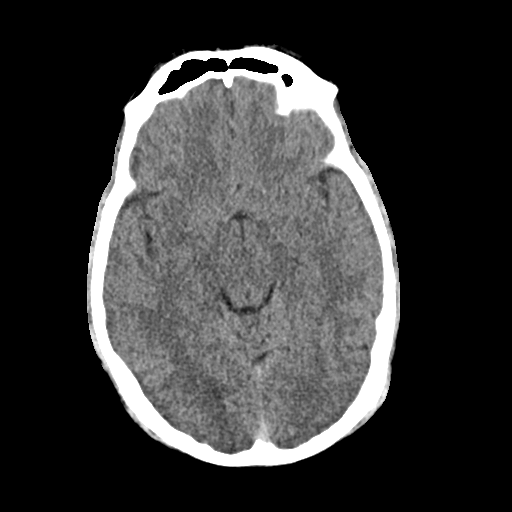
[im 17/32  brain]
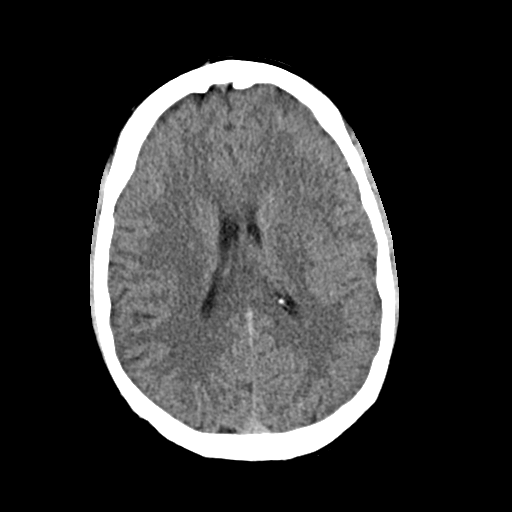
[im 17/32  bone]
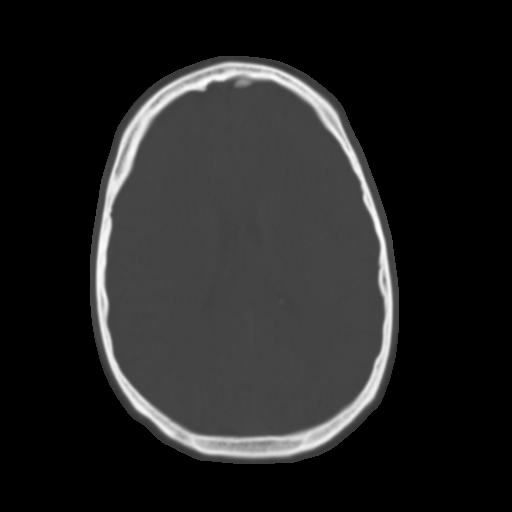
[im 20/32  brain]
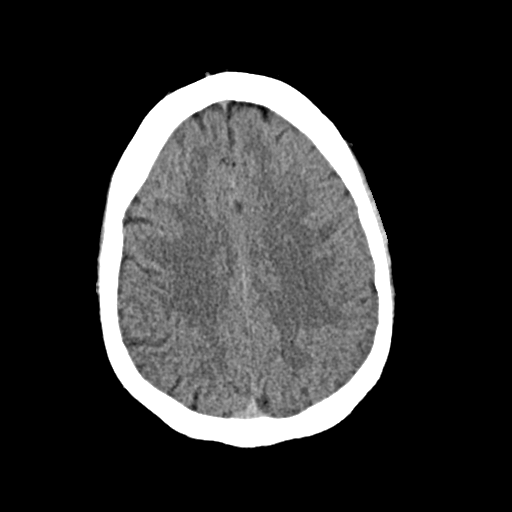
[im 23/32  brain]
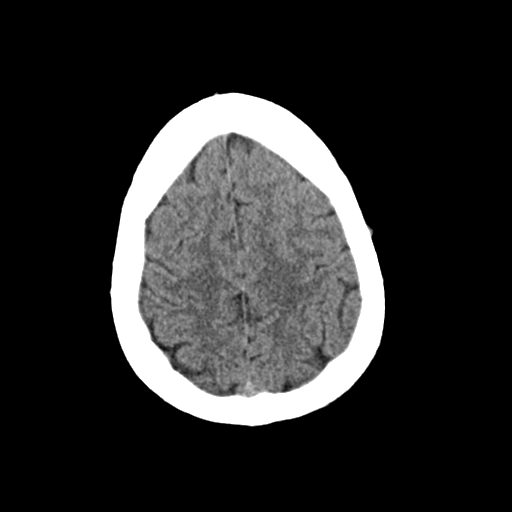
[im 26/32  brain]
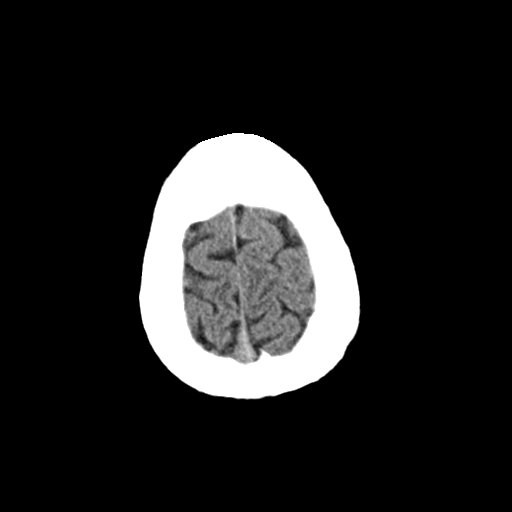
[im 29/32  brain]
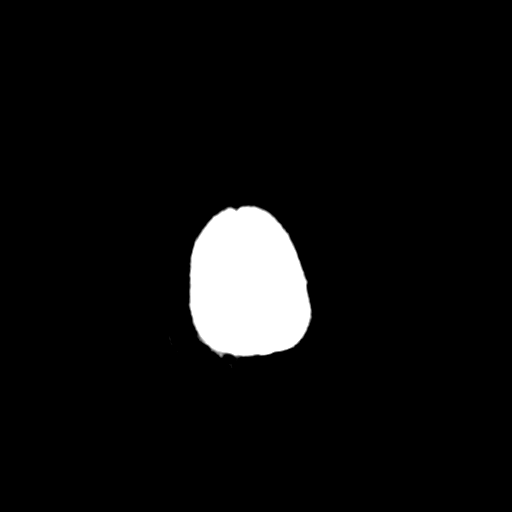
[im 29/32  bone]
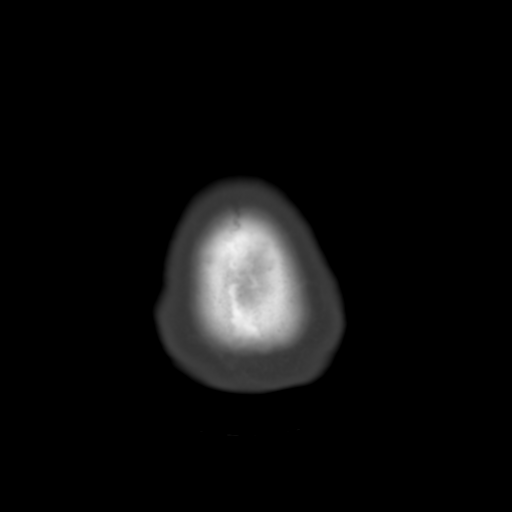

[Series 4: coronal soft · coronal · 0.32mm/px · 3 of 75 slices shown]
[im 25/75  brain]
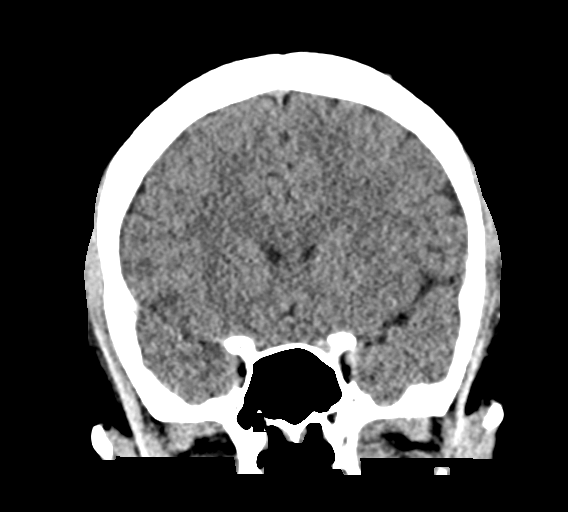
[im 33/75  brain]
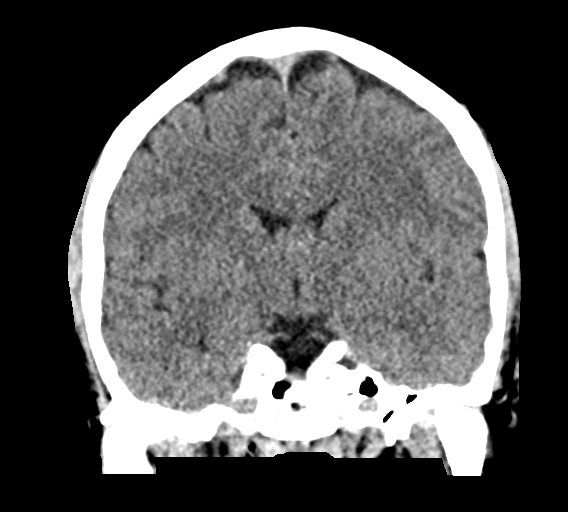
[im 42/75  brain]
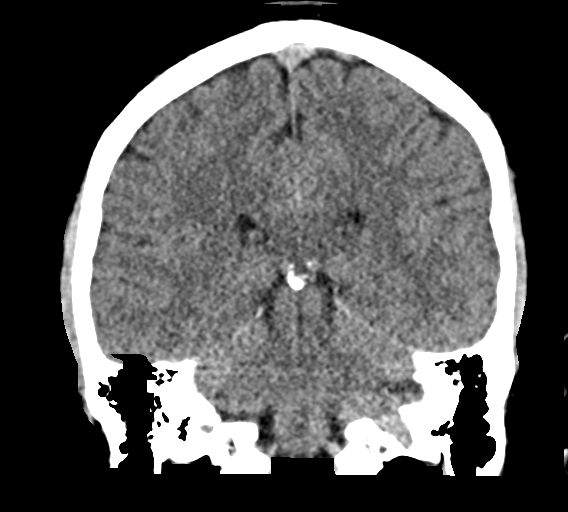

[Series 5: sag soft · sagittal · 0.31mm/px · 3 of 62 slices shown]
[im 21/62  brain]
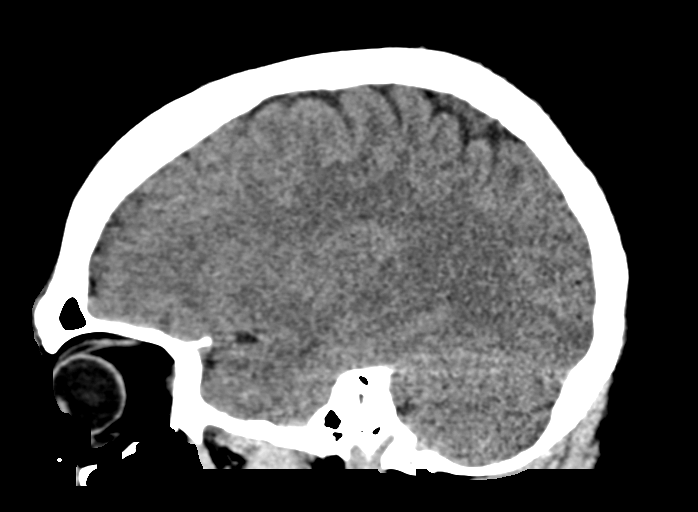
[im 31/62  brain]
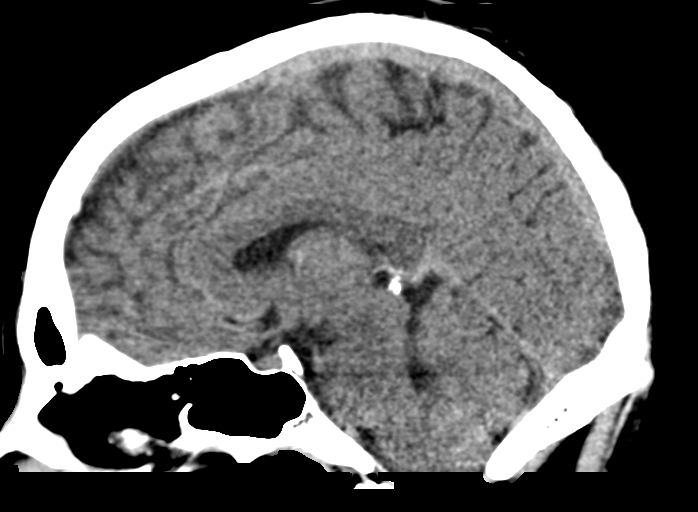
[im 41/62  brain]
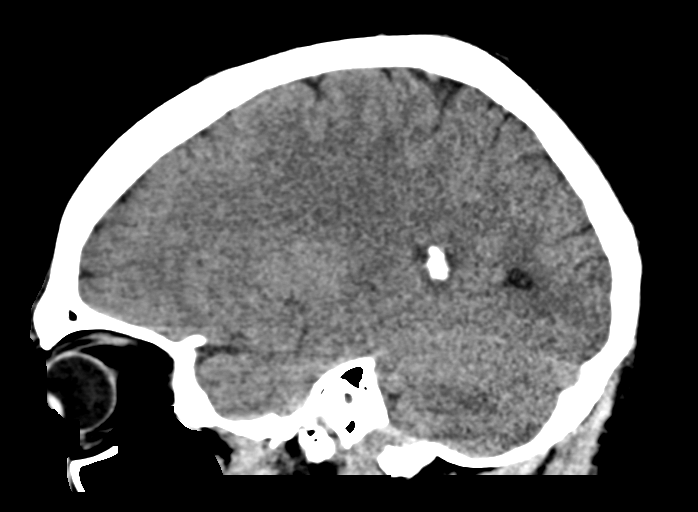

[15 of 47 positions shown; findings below may reference images not displayed]

FINDINGS: Brain: No evidence of acute infarction, hemorrhage, hydrocephalus,
extra-axial collection or mass lesion/mass effect.

Vascular: No hyperdense vessel or unexpected calcification.

Skull: Normal. Negative for fracture or focal lesion.

Sinuses/Orbits: No acute finding.

Other: None
IMPRESSION: No acute intracranial pathology.
